# Patient Record
Sex: Male | Born: 1995 | Race: White | Hispanic: No | Marital: Single | Smoking: Never smoker
Health system: Southern US, Community
[De-identification: ages and names within clinical notes are randomized; demographics above are authoritative.]

## PROBLEM LIST (undated history)

## (undated) DIAGNOSIS — N289 Disorder of kidney and ureter, unspecified: Secondary | ICD-10-CM

## (undated) HISTORY — PX: NO PAST SURGERIES: SHX2092

---
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
## 2015-04-24 IMAGING — CR DG FEMUR 2V*R*
1 series · 4 of 4 positions shown · non-contrast
Comparison: None.

CLINICAL DATA: Injury to right upper leg while practicing football.
Knot at the right upper-outer thigh. Initial encounter.

EXAM:
RIGHT FEMUR - 2 VIEW

[Series 1: dxr femur right · 0.14mm/px · 4 of 4 slices shown]
[im 1/4]
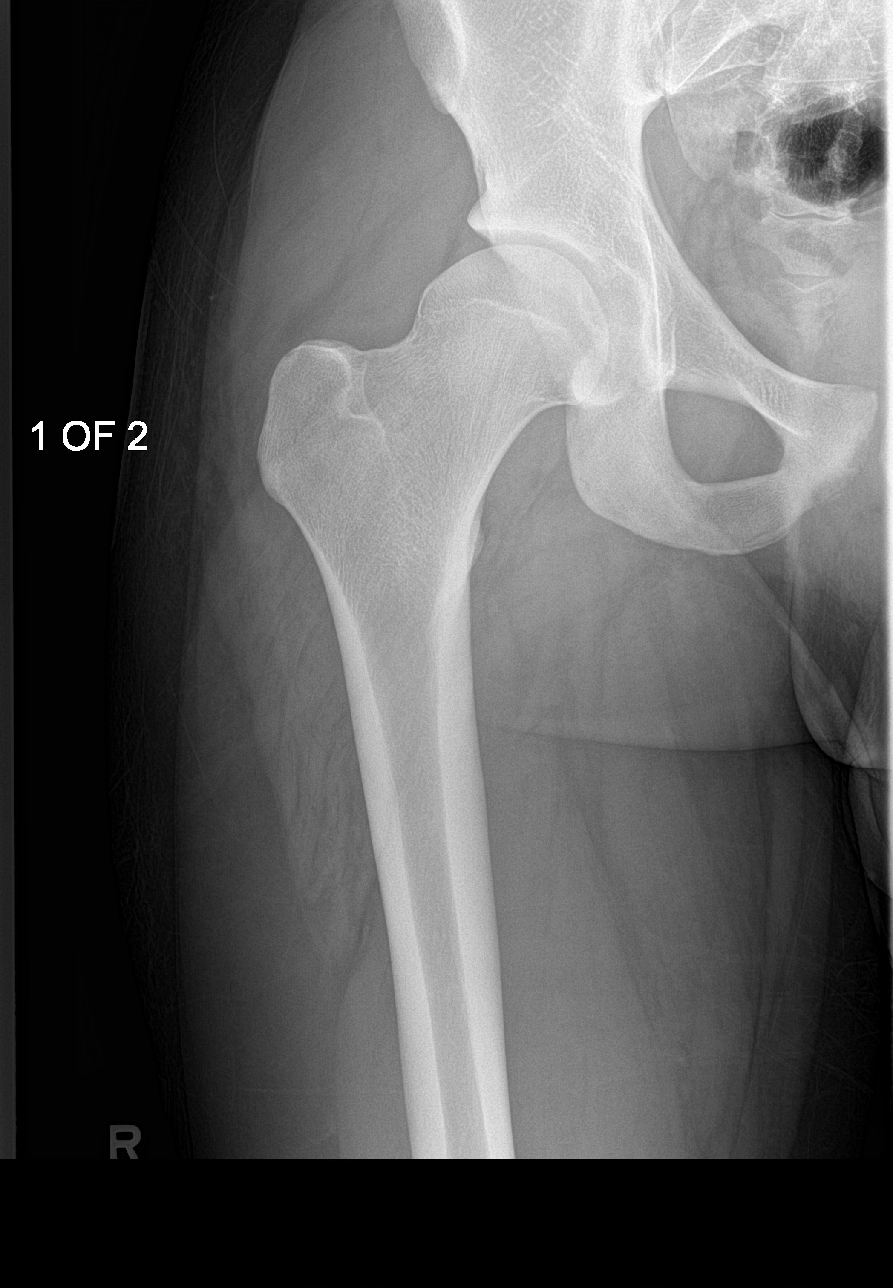
[im 2/4]
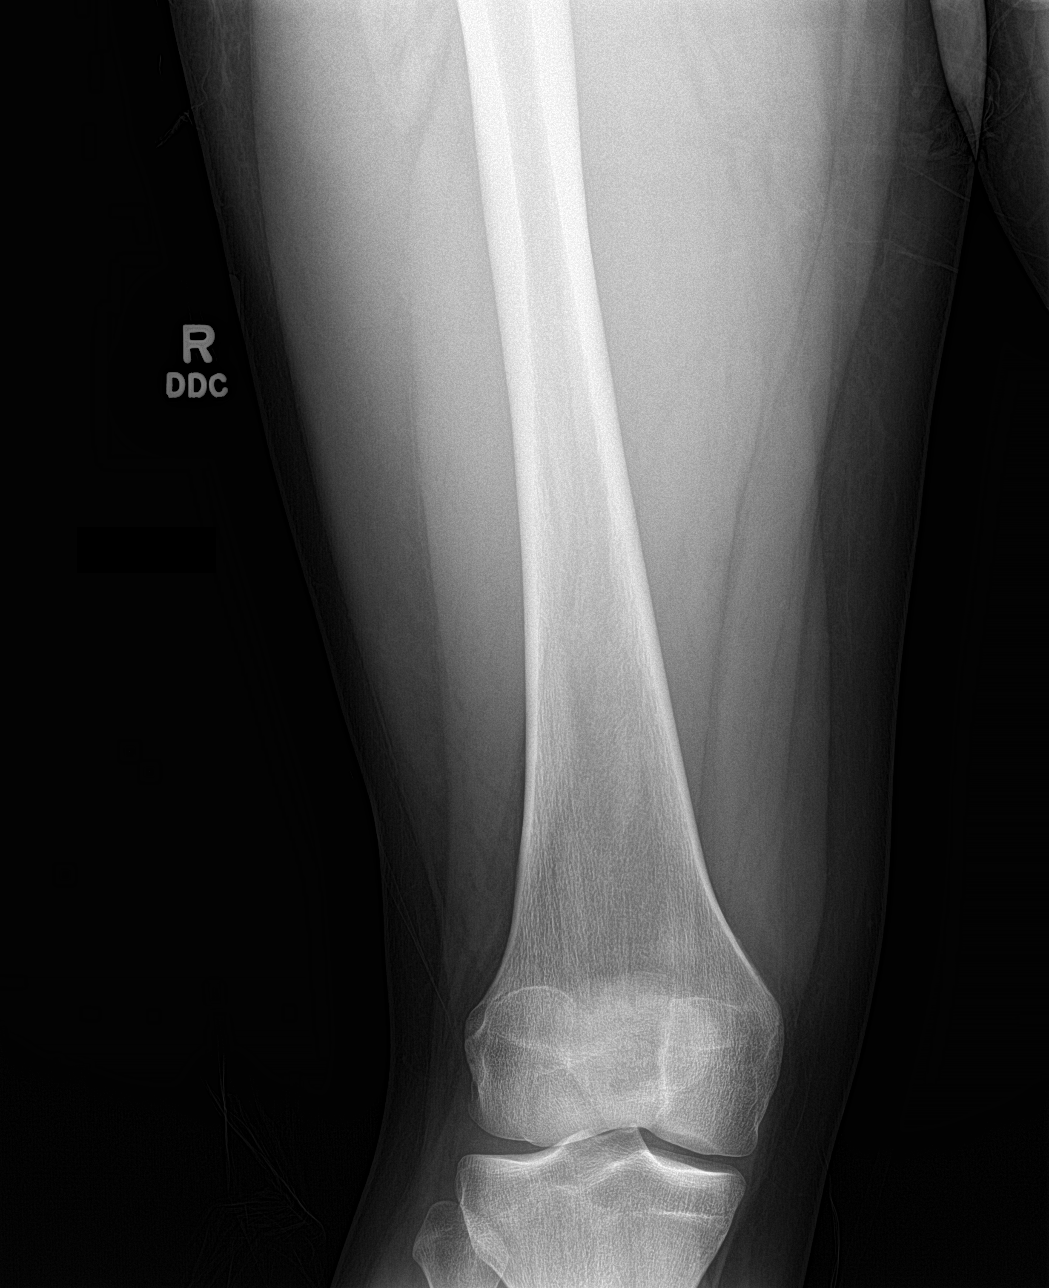
[im 3/4]
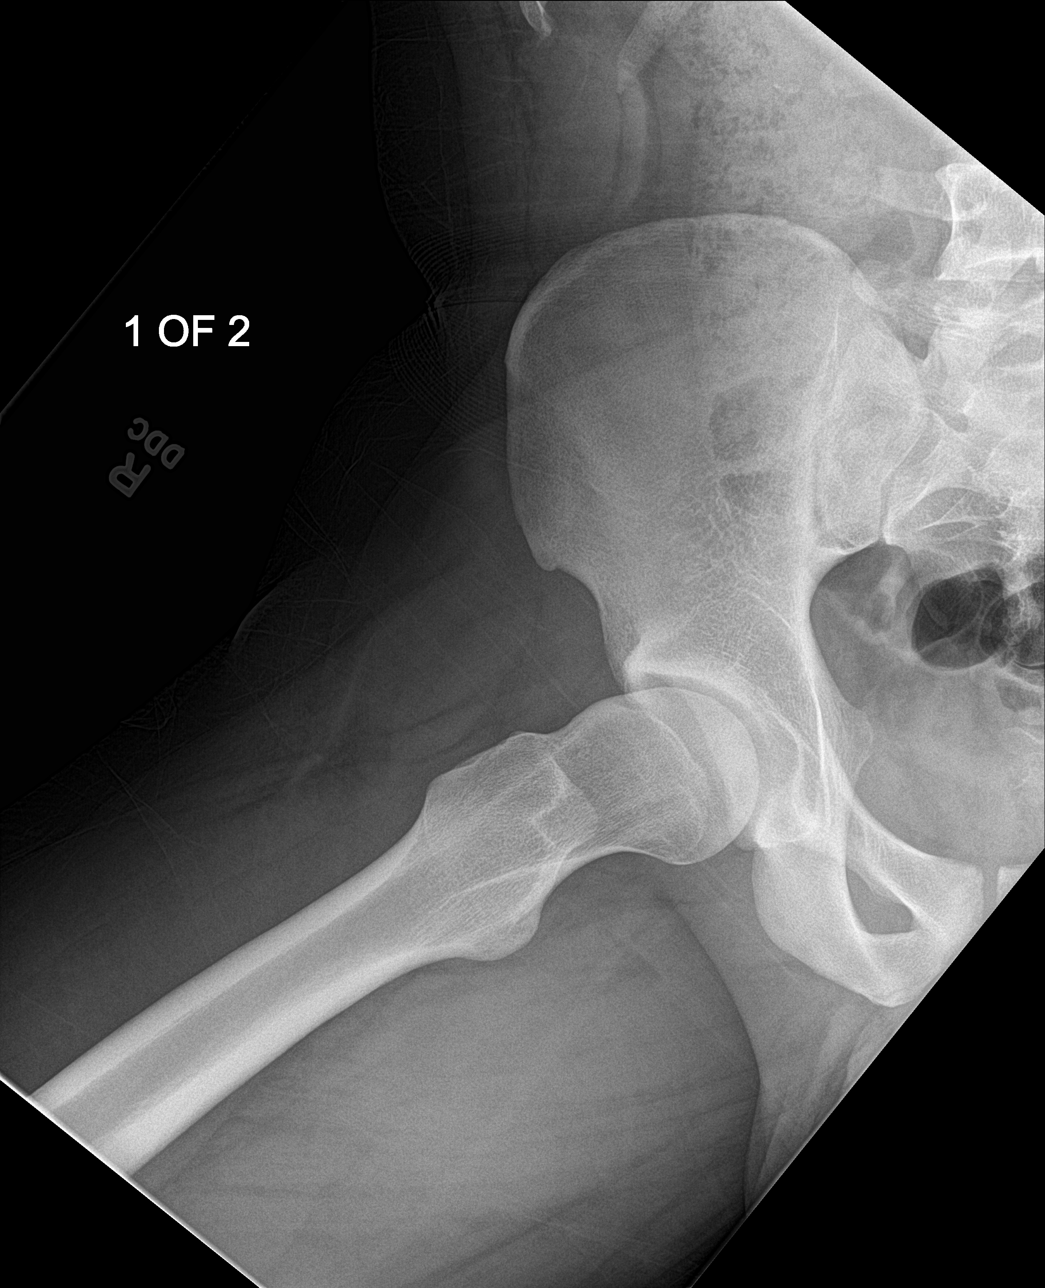
[im 4/4]
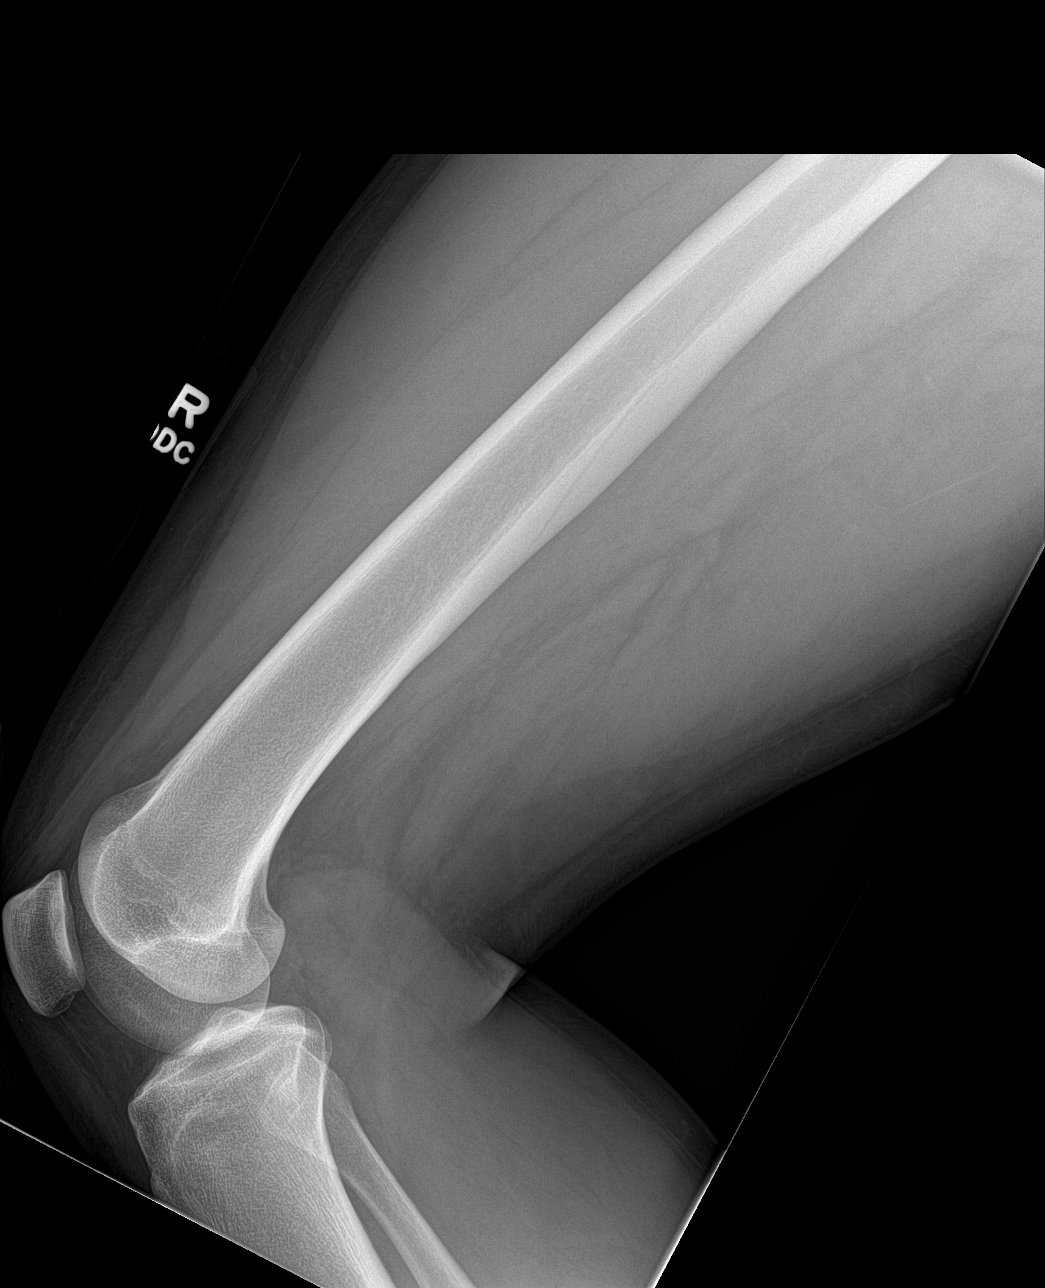

[4 of 4 positions shown; findings below may reference images not displayed]

FINDINGS: There is no evidence of fracture or dislocation. The right femur
appears intact. The right femoral head remains seated within the
acetabulum. The right sacroiliac joint is unremarkable in
appearance. The knee joint is unremarkable in appearance. No knee
joint effusion is identified.

Known soft tissue abnormality is not well characterized on
radiograph.
IMPRESSION: No evidence of fracture or dislocation.

## 2018-01-16 ENCOUNTER — Ambulatory Visit
Admission: EM | Admit: 2018-01-16 | Discharge: 2018-01-16 | Disposition: A | Discharge status: Home / Self Care | Payer: Worker's Compensation | Attending: Emergency Medicine | Admitting: Emergency Medicine

## 2018-01-16 ENCOUNTER — Other Ambulatory Visit: Payer: Self-pay

## 2018-01-16 ENCOUNTER — Encounter: Payer: Self-pay | Admitting: Emergency Medicine

## 2018-01-16 DIAGNOSIS — Z23 Encounter for immunization: Secondary | ICD-10-CM | POA: Diagnosis not present

## 2018-01-16 DIAGNOSIS — S61211A Laceration without foreign body of left index finger without damage to nail, initial encounter: Secondary | ICD-10-CM

## 2018-01-16 DIAGNOSIS — W260XXA Contact with knife, initial encounter: Secondary | ICD-10-CM

## 2018-01-16 MED ORDER — CEPHALEXIN 500 MG PO CAPS: 500.0000 mg | ORAL_CAPSULE | Freq: Four times a day (QID) | ORAL | 0 refills | Status: DC

## 2018-01-16 MED ORDER — TETANUS-DIPHTH-ACELL PERTUSSIS 5-2.5-18.5 LF-MCG/0.5 IM SUSP
0.5000 mL | Freq: Once | INTRAMUSCULAR | Status: AC
  Administered 2018-01-16: 0.5 mL via INTRAMUSCULAR

## 2018-01-16 MED ORDER — MUPIROCIN CALCIUM 2 % EX CREA: 1.0000 "application " | TOPICAL_CREAM | Freq: Two times a day (BID) | CUTANEOUS | 0 refills | Status: DC

## 2018-01-16 MED ORDER — KETOROLAC TROMETHAMINE 60 MG/2ML IM SOLN: 60.0000 mg | Freq: Once | INTRAMUSCULAR | Status: DC

## 2018-01-16 NOTE — ED Provider Notes (Signed)
[facility]    CSN: [id number] Arrival date & time: 01/16/18  1106     History   Chief Complaint Chief Complaint  Patient presents with  . Laceration    HPI Christian Benson is a 22 y.o. male.   Patient is a 22 year old male who was at work cutting his fingernails with a pocket knife when he cut the side of his left index finger.  Patient states he is unsure when his last tetanus was may be back in middle school.  He reports he is able to move it and has sensation.     History reviewed. No pertinent past medical history.  There are no active problems to display for this patient.   Past Surgical History:  Procedure Laterality Date  . NO PAST SURGERIES         Home Medications    Prior to Admission medications   Medication Sig Start Date End Date Taking? Authorizing Provider  cephALEXin (KEFLEX) 500 MG capsule Take 1 capsule (500 mg total) by mouth 4 (four) times daily. 01/16/18   Candis SchatzHarris, Nathanyel Defenbaugh D, PA-C    Family History Family History  Problem Relation Age of Onset  . Healthy Mother   . Healthy Father     Social History Social History   Tobacco Use  . Smoking status: Never Smoker  . Smokeless tobacco: Current User    Types: Snuff  Substance Use Topics  . Alcohol use: Yes    Comment: 3-4 times a week  . Drug use: Never     Allergies   Patient has no known allergies.   Review of Systems Review of Systems as noted above HPI.  Other systems reviewed and found to be negative   Physical Exam Triage Vital Signs ED Triage Vitals  Enc Vitals Group     BP 01/16/18 1154 (!) 160/95     Pulse Rate 01/16/18 1154 91     Resp 01/16/18 1154 17     Temp 01/16/18 1154 98.2 F (36.8 C)     Temp Source 01/16/18 1154 Oral     SpO2 01/16/18 1154 100 %     Weight 01/16/18 1155 235 lb (106.6 kg)     Height 01/16/18 1155 6' (1.829 m)     Head Circumference --      Peak Flow --      Pain Score 01/16/18 1155 4     Pain Loc --      Pain Edu? --      Excl. in [place]? --    No data found.  Updated Vital Signs BP (!) 160/95 (BP Location: Right Arm)   Pulse 91   Temp 98.2 F (36.8 C) (Oral)   Resp 17   Ht 6' (1.829 m)   Wt 235 lb (106.6 kg)   SpO2 100%   BMI 31.87 kg/m    Physical Exam  Constitutional: He appears well-developed and well-nourished.  Mild distress from finger pain  Musculoskeletal:       Left hand: He exhibits tenderness and laceration.       Hands:        UC Treatments / Results  Labs (all labs ordered are listed, but only abnormal results are displayed) Labs Reviewed - No data to display  EKG None  Radiology No results found.  Procedures Laceration Repair Date/Time: 01/16/2018 4:13 PM Performed by: Candis SchatzHarris, Darvell Monteforte D, PA-C Authorized by: Domenick GongMortenson, Ashley, MD   Consent:    Consent obtained:  Verbal  Consent given by:  Patient Anesthesia (see MAR for exact dosages):    Anesthesia method:  Local infiltration and nerve block   Local anesthetic:  Lidocaine 1% w/o epi   Block location:  Proximal L index finger   Block needle gauge:  27 G   Block injection procedure:  Negative aspiration for blood and incremental injection   Block outcome:  Incomplete block Laceration details:    Location:  Finger   Finger location:  L index finger   Length (cm):  6   Laceration depth: flap/avulsion type laceration. Repair type:    Repair type:  Simple Pre-procedure details:    Preparation:  Patient was prepped and draped in usual sterile fashion Exploration:    Hemostasis achieved with:  Direct pressure (minimal arteriole bleeding with manipulation, controlled with pressure)   Contaminated: no   Treatment:    Area cleansed with:  Hibiclens   Amount of cleaning:  Standard   Irrigation solution:  Sterile water   Irrigation method:  Syringe Skin repair:    Repair method:  Sutures   Suture size:  5-0   Suture material:  Prolene Approximation:    Approximation:  Close Post-procedure details:     Dressing:  Non-adherent dressing   Patient tolerance of procedure:  Tolerated well, no immediate complications   (including critical care time)  Medications Ordered in UC Medications  Tdap (BOOSTRIX) injection 0.5 mL (0.5 mLs Intramuscular Given 01/16/18 1158)    Initial Impression / Assessment and Plan / UC Course  I have reviewed the triage vital signs and the nursing notes.  Pertinent labs & imaging results that were available during my care of the patient were reviewed by me and considered in my medical decision making (see chart for details).     U-shaped laceration to the finger creating a avulsion type injury.  13 sutures placed with good approximation.  Some swelling noted.  Wound cleaned and dressed afterwards.  Placed in splint.  Patient given prescription for antibiotics.  Final Clinical Impressions(s) / UC Diagnoses   Final diagnoses:  Laceration of left index finger without foreign body without damage to nail, initial encounter     Discharge Instructions     -Keflex: one tablet 4 times a day for 5 days -keep dry for 24 hours -Tylenol for pain -cleanse with gentle soap and warm water afterwards -change bandage twice daily for two days then keep open and dry as much as possible -stitches out 10 days -return to clinic with any signs of infection... Redness, swelling, foul discharge, fever -keep in splint as much as able to protect while healing    ED Prescriptions    Medication Sig Dispense Auth. Provider   cephALEXin (KEFLEX) 500 MG capsule Take 1 capsule (500 mg total) by mouth 4 (four) times daily. 20 capsule Candis Schatz, PA-C   mupirocin cream (BACTROBAN) 2 %  (Status: Discontinued) Apply 1 application topically 2 (two) times daily. 15 g Candis Schatz, PA-C     Controlled Substance Prescriptions [place] Controlled Substance Registry consulted? Not Applicable   Candis Schatz, PA-C 01/16/18 1610

## 2018-01-16 NOTE — ED Triage Notes (Signed)
Pt here today for a lacertation of the left index finger. He was trimming his finger nails with his pocket knife and cut his finger. Pt is still activley bleeding. Tdap not UTD.

## 2018-01-16 NOTE — Discharge Instructions (Addendum)
-  Keflex: one tablet 4 times a day for 5 days -keep dry for 24 hours -Tylenol for pain -cleanse with gentle soap and warm water afterwards -change bandage twice daily for two days then keep open and dry as much as possible -stitches out 10 days -return to clinic with any signs of infection... Redness, swelling, foul discharge, fever -keep in splint as much as able to protect while healing

## 2019-06-22 ENCOUNTER — Ambulatory Visit
Admission: EM | Admit: 2019-06-22 | Discharge: 2019-06-22 | Disposition: A | Discharge status: Home / Self Care | Payer: PRIVATE HEALTH INSURANCE | Attending: Internal Medicine | Admitting: Internal Medicine

## 2019-06-22 ENCOUNTER — Other Ambulatory Visit: Payer: Self-pay

## 2019-06-22 DIAGNOSIS — N200 Calculus of kidney: Secondary | ICD-10-CM | POA: Diagnosis not present

## 2019-06-22 DIAGNOSIS — N23 Unspecified renal colic: Secondary | ICD-10-CM | POA: Insufficient documentation

## 2019-06-22 LAB — URINALYSIS, COMPLETE (UACMP) WITH MICROSCOPIC
Bilirubin Urine: NEGATIVE
Glucose, UA: NEGATIVE mg/dL
Ketones, ur: NEGATIVE mg/dL
Leukocytes,Ua: NEGATIVE
Nitrite: NEGATIVE
Specific Gravity, Urine: 1.02 (ref 1.005–1.030)
pH: 7.5 (ref 5.0–8.0)

## 2019-06-22 MED ORDER — TAMSULOSIN HCL 0.4 MG PO CAPS: 0.4000 mg | ORAL_CAPSULE | Freq: Every day | ORAL | 0 refills | Status: AC

## 2019-06-22 MED ORDER — TRAMADOL HCL 50 MG PO TABS: 50.0000 mg | ORAL_TABLET | Freq: Four times a day (QID) | ORAL | 0 refills | Status: DC | PRN

## 2019-06-22 NOTE — ED Triage Notes (Signed)
Left flank pain last week with some radiation to left groin area. Went away after drinking lots of water but came back again last night. Endorses vomiting.

## 2019-06-22 NOTE — ED Provider Notes (Signed)
[facility]    CSN: [id number] Arrival date & time: 06/22/19  1312      History   Chief Complaint Chief Complaint  Patient presents with  . Flank Pain    HPI Christian Benson is a 24 y.o. male with no past medical history comes to urgent care with complaints of left flank pain of 1 day duration.  Patient describes the pain as severe, and left flank region, radiates to the groin.  Pain is coli's.  Cky in nature.  No known relieving factors.  He has tried over-the-counter pain medications with no success.  He has had some nausea vomiting as well as diarrhea.  No fever or chills.  No dysuria urgency or frequency.  Patient had a similar episode a week ago that improved after he drank a lot of fluids.  HPI  History reviewed. No pertinent past medical history.  There are no problems to display for this patient.   Past Surgical History:  Procedure Laterality Date  . NO PAST SURGERIES         Home Medications    Prior to Admission medications   Medication Sig Start Date End Date Taking? Authorizing Provider  tamsulosin (FLOMAX) 0.4 MG CAPS capsule Take 1 capsule (0.4 mg total) by mouth daily for 10 days. 06/22/19 07/02/19  LampteyMyrene Galas, MD  traMADol (ULTRAM) 50 MG tablet Take 1 tablet (50 mg total) by mouth every 6 (six) hours as needed. 06/22/19   LampteyMyrene Galas, MD    Family History Family History  Problem Relation Age of Onset  . Healthy Mother   . Healthy Father     Social History Social History   Tobacco Use  . Smoking status: Never Smoker  . Smokeless tobacco: Current User    Types: Snuff  Substance Use Topics  . Alcohol use: Yes    Comment: 3-4 times a week  . Drug use: Never     Allergies   Patient has no known allergies.   Review of Systems Review of Systems  Constitutional: Negative for activity change, chills, fatigue and fever.  HENT: Negative.   Respiratory: Negative.   Gastrointestinal: Positive for diarrhea, nausea and  vomiting.  Genitourinary: Positive for flank pain. Negative for dysuria, frequency, scrotal swelling, testicular pain and urgency.  Skin: Negative.   Neurological: Negative for dizziness, seizures, numbness and headaches.  Hematological: Negative.   Psychiatric/Behavioral: Negative for confusion and decreased concentration.     Physical Exam Triage Vital Signs ED Triage Vitals  Enc Vitals Group     BP 06/22/19 1322 (!) 153/101     Pulse Rate 06/22/19 1322 79     Resp 06/22/19 1322 17     Temp 06/22/19 1322 98.4 F (36.9 C)     Temp Source 06/22/19 1322 Oral     SpO2 06/22/19 1322 99 %     Weight 06/22/19 1324 230 lb (104.3 kg)     Height 06/22/19 1324 6' (1.829 m)     Head Circumference --      Peak Flow --      Pain Score 06/22/19 1324 6     Pain Loc --      Pain Edu? --      Excl. in [place]? --    No data found.  Updated Vital Signs BP (!) 153/101 (BP Location: Left Arm)   Pulse 79   Temp 98.4 F (36.9 C) (Oral)   Resp 17   Ht 6' (1.829 m)  Wt 104.3 kg   SpO2 99%   BMI 31.19 kg/m   Visual Acuity Right Eye Distance:   Left Eye Distance:   Bilateral Distance:    Right Eye Near:   Left Eye Near:    Bilateral Near:     Physical Exam Constitutional:      General: He is in acute distress.     Appearance: Normal appearance. He is not ill-appearing.  Cardiovascular:     Rate and Rhythm: Normal rate and regular rhythm.     Pulses: Normal pulses.     Heart sounds: No murmur. No friction rub.  Pulmonary:     Effort: Pulmonary effort is normal. No respiratory distress.     Breath sounds: Normal breath sounds. No rhonchi or rales.  Abdominal:     General: Bowel sounds are normal. There is no distension.     Tenderness: There is no guarding.  Musculoskeletal:        General: No swelling or signs of injury. Normal range of motion.  Skin:    General: Skin is warm.     Capillary Refill: Capillary refill takes less than 2 seconds.     Coloration: Skin is not  jaundiced.     Findings: No bruising or erythema.  Neurological:     General: No focal deficit present.     Mental Status: He is alert and oriented to person, place, and time.      UC Treatments / Results  Labs (all labs ordered are listed, but only abnormal results are displayed) Labs Reviewed  URINALYSIS, COMPLETE (UACMP) WITH MICROSCOPIC - Abnormal; Notable for the following components:      Result Value   Hgb urine dipstick TRACE (*)    Protein, ur TRACE (*)    Bacteria, UA FEW (*)    All other components within normal limits    EKG   Radiology No results found.  Procedures Procedures (including critical care time)  Medications Ordered in UC Medications - No data to display  Initial Impression / Assessment and Plan / UC Course  I have reviewed the triage vital signs and the nursing notes.  Pertinent labs & imaging results that were available during my care of the patient were reviewed by me and considered in my medical decision making (see chart for details).     1.  Nephrolithiasis with renal colic: Urinalysis is significant for hemoglobin, leukocyte esterase. Urine cultures have been sent Tamsulosin 0.4 mg orally daily Tramadol 50 mg every 6 hours as needed for pain If patient gets worsening symptoms he is advised to return to urgent care to be reevaluated or go to the urologist office to be evaluated. Patient is encouraged to increase oral fluid intake to help with stone passage. Final Clinical Impressions(s) / UC Diagnoses   Final diagnoses:  Renal colic  Nephrolithiasis   Discharge Instructions   None    ED Prescriptions    Medication Sig Dispense Auth. Provider   tamsulosin (FLOMAX) 0.4 MG CAPS capsule Take 1 capsule (0.4 mg total) by mouth daily for 10 days. 10 capsule Nazire Fruth, Britta Mccreedy, MD   traMADol (ULTRAM) 50 MG tablet Take 1 tablet (50 mg total) by mouth every 6 (six) hours as needed. 15 tablet Lulabelle Desta, Britta Mccreedy, MD     I have reviewed  the PDMP during this encounter.   Merrilee Jansky, MD 06/22/19 1505

## 2019-12-14 ENCOUNTER — Other Ambulatory Visit: Payer: Self-pay

## 2019-12-14 ENCOUNTER — Encounter: Payer: Self-pay | Admitting: Emergency Medicine

## 2019-12-14 ENCOUNTER — Ambulatory Visit
Admission: EM | Admit: 2019-12-14 | Discharge: 2019-12-14 | Disposition: A | Discharge status: Home / Self Care | Payer: BC Managed Care – PPO | Attending: Family Medicine | Admitting: Family Medicine

## 2019-12-14 DIAGNOSIS — J029 Acute pharyngitis, unspecified: Secondary | ICD-10-CM | POA: Diagnosis present

## 2019-12-14 HISTORY — DX: Disorder of kidney and ureter, unspecified: N28.9

## 2019-12-14 LAB — GROUP A STREP BY PCR: Group A Strep by PCR: NOT DETECTED

## 2019-12-14 MED ORDER — AZITHROMYCIN 250 MG PO TABS: ORAL_TABLET | ORAL | 0 refills | Status: DC

## 2019-12-14 NOTE — Discharge Instructions (Signed)
Medication as prescribed.  Ibuprofen as needed.  Take care  Dr. Adriana Simas

## 2019-12-14 NOTE — ED Triage Notes (Signed)
Pt c/o sore throat, and right ear pain. Started about 4 days ago. Denies fever. He states it is hard to drink and eat due to his sore throat. Declines covid testing

## 2019-12-14 NOTE — ED Provider Notes (Signed)
[facility]    CSN: [id number] Arrival date & time: 12/14/19  1232      History   Chief Complaint Chief Complaint  Patient presents with  . Sore Throat  . Otalgia    right   HPI  24 year old male presents with the above complaints.  Patient reports that his symptoms started on Thursday.  He reports sore throat.  Also reports associated right ear pain.  No fever.  He reports difficulty swallowing.  Rates his pain a 7/10 in severity.  Described as a sharp pain.  No relieving factors.  No reported sick contacts.  No other complaints.  Past Medical History:  Diagnosis Date  . Renal disorder    kidney stone   Past Surgical History:  Procedure Laterality Date  . NO PAST SURGERIES     Home Medications    Prior to Admission medications   Medication Sig Start Date End Date Taking? Authorizing Provider  azithromycin (ZITHROMAX) 250 MG tablet 2 tablets on day 1, then 1 tablet daily on days 2-5. 12/14/19   Coral Spikes, DO  traMADol (ULTRAM) 50 MG tablet Take 1 tablet (50 mg total) by mouth every 6 (six) hours as needed. 06/22/19   LampteyMyrene Galas, MD    Family History Family History  Problem Relation Age of Onset  . Healthy Mother   . Healthy Father     Social History Social History   Tobacco Use  . Smoking status: Never Smoker  . Smokeless tobacco: Current User    Types: Snuff  Vaping Use  . Vaping Use: Some days  Substance Use Topics  . Alcohol use: Yes    Comment: 3-4 times a week  . Drug use: Never     Allergies   Patient has no known allergies.   Review of Systems Review of Systems  Constitutional: Negative for fever.  HENT: Positive for ear pain and sore throat.    Physical Exam Triage Vital Signs ED Triage Vitals  Enc Vitals Group     BP 12/14/19 1409 125/89     Pulse Rate 12/14/19 1409 88     Resp 12/14/19 1409 18     Temp 12/14/19 1409 98.8 F (37.1 C)     Temp Source 12/14/19 1409 Oral     SpO2 12/14/19 1409 99 %     Weight  12/14/19 1405 230 lb (104.3 kg)     Height 12/14/19 1405 6' (1.829 m)     Head Circumference --      Peak Flow --      Pain Score 12/14/19 1405 7     Pain Loc --      Pain Edu? --      Excl. in [place]? --    No data found.  Updated Vital Signs BP 125/89 (BP Location: Left Arm)   Pulse 88   Temp 98.8 F (37.1 C) (Oral)   Resp 18   Ht 6' (1.829 m)   Wt 104.3 kg   SpO2 99%   BMI 31.19 kg/m   Visual Acuity Right Eye Distance:   Left Eye Distance:   Bilateral Distance:    Right Eye Near:   Left Eye Near:    Bilateral Near:     Physical Exam Vitals and nursing note reviewed.  Constitutional:      General: He is not in acute distress.    Appearance: Normal appearance. He is not ill-appearing.  HENT:     Head: Normocephalic and atraumatic.  Right Ear: Tympanic membrane normal.     Left Ear: Tympanic membrane normal.     Mouth/Throat:     Pharynx: Posterior oropharyngeal erythema present. No oropharyngeal exudate.  Eyes:     General:        Right eye: No discharge.        Left eye: No discharge.     Conjunctiva/sclera: Conjunctivae normal.  Cardiovascular:     Rate and Rhythm: Normal rate and regular rhythm.  Pulmonary:     Effort: Pulmonary effort is normal.     Breath sounds: Normal breath sounds. No wheezing, rhonchi or rales.  Neurological:     Mental Status: He is alert.  Psychiatric:        Mood and Affect: Mood normal.        Behavior: Behavior normal.    UC Treatments / Results  Labs (all labs ordered are listed, but only abnormal results are displayed) Labs Reviewed  GROUP A STREP BY PCR    EKG   Radiology No results found.  Procedures Procedures (including critical care time)  Medications Ordered in UC Medications - No data to display  Initial Impression / Assessment and Plan / UC Course  I have reviewed the triage vital signs and the nursing notes.  Pertinent labs & imaging results that were available during my care of the patient  were reviewed by me and considered in my medical decision making (see chart for details).    24 year old male presents with acute pharyngitis.  Strep PCR negative.  Placing on azithromycin to cover for atypicals given persistent symptoms and the severity of his symptoms.  Final Clinical Impressions(s) / UC Diagnoses   Final diagnoses:  Pharyngitis, unspecified etiology     Discharge Instructions     Medication as prescribed.  Ibuprofen as needed.  Take care  Dr. Adriana Simas    ED Prescriptions    Medication Sig Dispense Auth. Provider   azithromycin (ZITHROMAX) 250 MG tablet 2 tablets on day 1, then 1 tablet daily on days 2-5. 6 tablet Tommie Sams, DO     PDMP not reviewed this encounter.   Tommie Sams, [place] 12/14/19 2035

## 2020-05-02 ENCOUNTER — Other Ambulatory Visit: Payer: Self-pay

## 2020-05-02 ENCOUNTER — Encounter (HOSPITAL_COMMUNITY): Payer: Self-pay

## 2020-05-02 ENCOUNTER — Emergency Department (HOSPITAL_COMMUNITY)
Admission: EM | Admit: 2020-05-02 | Discharge: 2020-05-03 | Disposition: A | Discharge status: Home / Self Care | Payer: BC Managed Care – PPO | Source: Home / Self Care | Attending: Emergency Medicine | Admitting: Emergency Medicine

## 2020-05-02 DIAGNOSIS — R45851 Suicidal ideations: Secondary | ICD-10-CM | POA: Insufficient documentation

## 2020-05-02 DIAGNOSIS — Z20822 Contact with and (suspected) exposure to covid-19: Secondary | ICD-10-CM | POA: Insufficient documentation

## 2020-05-02 DIAGNOSIS — F329 Major depressive disorder, single episode, unspecified: Secondary | ICD-10-CM | POA: Insufficient documentation

## 2020-05-02 DIAGNOSIS — F322 Major depressive disorder, single episode, severe without psychotic features: Secondary | ICD-10-CM | POA: Diagnosis not present

## 2020-05-02 LAB — COMPREHENSIVE METABOLIC PANEL
ALT: 19 U/L (ref 0–44)
AST: 20 U/L (ref 15–41)
Albumin: 4.4 g/dL (ref 3.5–5.0)
Alkaline Phosphatase: 45 U/L (ref 38–126)
Anion gap: 8 (ref 5–15)
BUN: 9 mg/dL (ref 6–20)
CO2: 25 mmol/L (ref 22–32)
Calcium: 9.1 mg/dL (ref 8.9–10.3)
Chloride: 106 mmol/L (ref 98–111)
Creatinine, Ser: 1.12 mg/dL (ref 0.61–1.24)
GFR, Estimated: >60 mL/min (ref >60)
Glucose, Bld: 114 mg/dL — ABNORMAL HIGH (ref 70–99)
Potassium: 3.9 mmol/L (ref 3.5–5.1)
Sodium: 139 mmol/L (ref 135–145)
Total Bilirubin: 1 mg/dL (ref 0.3–1.2)
Total Protein: 7.6 g/dL (ref 6.5–8.1)

## 2020-05-02 LAB — CBC
HCT: 47.6 % (ref 39.0–52.0)
Hemoglobin: 16 g/dL (ref 13.0–17.0)
MCH: 29.8 pg (ref 26.0–34.0)
MCHC: 33.6 g/dL (ref 30.0–36.0)
MCV: 88.6 fL (ref 80.0–100.0)
Platelets: 288 10*3/uL (ref 150–400)
RBC: 5.37 MIL/uL (ref 4.22–5.81)
RDW: 12.7 % (ref 11.5–15.5)
WBC: 8.8 10*3/uL (ref 4.0–10.5)
nRBC: 0 % (ref 0.0–0.2)

## 2020-05-02 LAB — SALICYLATE LEVEL: Salicylate Lvl: <7 mg/dL — ABNORMAL LOW (ref 7.0–30.0)

## 2020-05-02 LAB — RAPID URINE DRUG SCREEN, HOSP PERFORMED
Amphetamines: NOT DETECTED
Barbiturates: NOT DETECTED
Benzodiazepines: NOT DETECTED
Cocaine: NOT DETECTED
Opiates: NOT DETECTED
Tetrahydrocannabinol: NOT DETECTED

## 2020-05-02 LAB — ACETAMINOPHEN LEVEL: Acetaminophen (Tylenol), Serum: <10 ug/mL — ABNORMAL LOW (ref 10–30)

## 2020-05-02 LAB — ETHANOL: Alcohol, Ethyl (B): <10 mg/dL (ref <10)

## 2020-05-02 MED ORDER — IBUPROFEN 200 MG PO TABS: 600.0000 mg | ORAL_TABLET | Freq: Three times a day (TID) | ORAL | Status: DC | PRN

## 2020-05-02 NOTE — ED Notes (Addendum)
Spoke with patients mother who states she is concerned patient will leave if sent to [facility] due to flight risk.   Celso Amy family friend  [phone]

## 2020-05-02 NOTE — ED Notes (Addendum)
Pt's belongings placed at nurses station and labeled with patient labels. Security wanded pt.

## 2020-05-02 NOTE — ED Notes (Addendum)
Pt aware urine sample needed 

## 2020-05-02 NOTE — ED Triage Notes (Signed)
Patient arrived voluntarily with complaints of anger issues, reports telling his significant other he wanted to shoot himself in the head. Reports feeling like hurting someone else as well.

## 2020-05-02 NOTE — ED Provider Notes (Signed)
[facility]-EMERGENCY DEPT Provider Note   CSN: [id number] Arrival date & time: 05/02/20  1912     History Chief Complaint  Patient presents with  . Suicidal    Christian Benson is a 24 y.o. male.  HPI   Patient with no significant medical history presents to the emergency department with chief complaint of suicidal ideation.  Patient endorses today he had thoughts of taking a gun and shooting himself.  He states over the last few months he has been under a lot of pressure and has been battling with anxiety.  He denies suicidal thoughts in the past, has never attempted suicide, denies current drug or alcohol abuse.  He denies any homicidal ideations, delusions or hallucinations.  Patient has no other complaints at this time, he denies headaches, fevers, chills, shortness of breath, chest pain, abdominal pain, nausea, vomiting, diarrhea, pedal edema.  After reviewing patient's chart no psychiatric history present.  Spoke with patient's mother who is at bedside who informs me that the patient has been battling with depression and has his highs and lows.  she is concerned that if he does not get treatment this will does happen again.  Past Medical History:  Diagnosis Date  . Renal disorder    kidney stone    There are no problems to display for this patient.   Past Surgical History:  Procedure Laterality Date  . NO PAST SURGERIES         Family History  Problem Relation Age of Onset  . Healthy Mother   . Healthy Father     Social History   Tobacco Use  . Smoking status: Never Smoker  . Smokeless tobacco: Current User    Types: Snuff  Vaping Use  . Vaping Use: Some days  Substance Use Topics  . Alcohol use: Yes    Comment: 3-4 times a week  . Drug use: Never    Home Medications Prior to Admission medications   Medication Sig Start Date End Date Taking? Authorizing Provider  azithromycin (ZITHROMAX) 250 MG tablet 2 tablets on day 1, then 1  tablet daily on days 2-5. 12/14/19   Tommie Sams, DO  traMADol (ULTRAM) 50 MG tablet Take 1 tablet (50 mg total) by mouth every 6 (six) hours as needed. 06/22/19   Lamptey, Britta Mccreedy, MD    Allergies    Patient has no known allergies.  Review of Systems   Review of Systems  Constitutional: Negative for chills and fever.  HENT: Negative for congestion.   Respiratory: Negative for shortness of breath.   Cardiovascular: Negative for chest pain.  Gastrointestinal: Negative for abdominal pain, diarrhea, nausea and vomiting.  Genitourinary: Negative for enuresis and flank pain.  Musculoskeletal: Negative for back pain.  Skin: Negative for rash.  Neurological: Negative for dizziness.  Hematological: Does not bruise/bleed easily.  Psychiatric/Behavioral: Positive for self-injury and suicidal ideas. Negative for hallucinations.    Physical Exam Updated Vital Signs BP 134/84   Pulse 81   Temp 98.7 F (37.1 C) (Oral)   Resp 17   SpO2 99%   Physical Exam Vitals and nursing note reviewed.  Constitutional:      General: He is not in acute distress.    Appearance: He is not ill-appearing.  HENT:     Head: Normocephalic and atraumatic.     Right Ear: Tympanic membrane, ear canal and external ear normal.     Left Ear: Tympanic membrane, ear canal and external ear normal.  Nose: No congestion.     Mouth/Throat:     Mouth: Mucous membranes are moist.     Pharynx: Oropharynx is clear. No oropharyngeal exudate or posterior oropharyngeal erythema.  Eyes:     Conjunctiva/sclera: Conjunctivae normal.  Cardiovascular:     Rate and Rhythm: Normal rate and regular rhythm.     Pulses: Normal pulses.     Heart sounds: No murmur heard.  No friction rub. No gallop.   Pulmonary:     Effort: No respiratory distress.     Breath sounds: No wheezing, rhonchi or rales.  Abdominal:     Palpations: Abdomen is soft.     Tenderness: There is no guarding.  Musculoskeletal:     Right lower leg: No  edema.     Left lower leg: No edema.  Skin:    General: Skin is warm and dry.     Comments: Skin exam was performed no track marks, lacerations, abrasions, ecchymosis noted on exam.  No erythematous joints present  Neurological:     General: No focal deficit present.     Mental Status: He is alert.  Psychiatric:        Mood and Affect: Mood normal.     ED Results / Procedures / Treatments   Labs (all labs ordered are listed, but only abnormal results are displayed) Labs Reviewed  COMPREHENSIVE METABOLIC PANEL - Abnormal; Notable for the following components:      Result Value   Glucose, Bld 114 (*)    All other components within normal limits  SALICYLATE LEVEL - Abnormal; Notable for the following components:   Salicylate Lvl <7.0 (*)    All other components within normal limits  ACETAMINOPHEN LEVEL - Abnormal; Notable for the following components:   Acetaminophen (Tylenol), Serum <10 (*)    All other components within normal limits  RESPIRATORY PANEL BY RT PCR (FLU A&B, COVID)  ETHANOL  CBC  RAPID URINE DRUG SCREEN, HOSP PERFORMED    EKG None  EKG Interpretation  Date/Time: 05/02/20   21:12:00   Ventricular Rate:   75 PR Interval: 160   QRS Duration:121   QT Interval: 396   QTC Calculation:443   R Axis:  Normal axis   Text Interpretation:   rhythm with IVCD, no MI present read by Kristine Royal, MD.  Radiology No results found.  Procedures Procedures (including critical care time)  Medications Ordered in ED Medications  ibuprofen (ADVIL) tablet 600 mg (has no administration in time range)    ED Course  I have reviewed the triage vital signs and the nursing notes.  Pertinent labs & imaging results that were available during my care of the patient were reviewed by me and considered in my medical decision making (see chart for details).    MDM Rules/Calculators/A&P                          Patient presents with suicidal ideations.  He is alert, does not  appear in acute distress, vital signs reassuring.  Will order med clearance labs and consult with TTS for further evaluation.  Will defer IVC at this time as patient endorses he will stay for the entire treatment does not wish to leave at this time.  He was informed that if he tries to leave I will have to IVC him.  He is in agreement understands these terms.  CBC negative for leukocytosis or signs of anemia.  CMP negative for electrolyte  abnormalities, no metabolic acidosis, hyperglycemia of 114, no AKI, no anion gap noted.  Acetaminophen less than 10, salicylate less than 7, ethanol less than 10.  Rapid drug screen negative.  EKG shows sinus rhythm with IVCD, no MI present read by Kristine Royal, MD.  No signs of ischemia no ST elevation depression noted.  Low suspicion for systemic infection as patient is nontoxic-appearing, vital signs reassuring, no obvious infection on exam, no leukocytosis evident on CBC.  Low suspicion for ACS or cardiac abnormality as patient has chest pain, shortness of breath, EKG sinus rhythm sinus without signs of ischemia.  Low suspicion for abdominal abnormality requiring immediate intervention as patient denies abdominal pain, abdomen soft nontender to palpation no acute abdomen on exam.  Low suspicion for toxic ingestion as patient denies doing so, no abnormalities noted on lab work.  Low suspicion for drug abuse as rapid drug screen  is negative.  Patient is medically cleared and will await further recommendations from TTS.  Patient is here voluntarily and is not under IVC at this time. home meds have been ordered and   placed in a psych hold.    Final Clinical Impression(s) / ED Diagnoses Final diagnoses:  Suicidal ideation    Rx / DC Orders ED Discharge Orders    None       Carroll Sage, PA-C 05/02/20 2342    Sabas Sous, MD 05/02/20 2350

## 2020-05-02 NOTE — ED Notes (Signed)
Pt was given burgundy scrubs and instructed to change into them while placing his personal belongings in bags. Will check back to collect his belongings and have pt wanded by security.

## 2020-05-03 ENCOUNTER — Inpatient Hospital Stay (HOSPITAL_COMMUNITY)
Admission: AD | Admit: 2020-05-03 | Discharge: 2020-05-05 | DRG: 885 | Disposition: A | Discharge status: Home / Self Care | Payer: BC Managed Care – PPO | Source: Other Acute Inpatient Hospital | Attending: Psychiatry | Admitting: Psychiatry

## 2020-05-03 ENCOUNTER — Encounter (HOSPITAL_COMMUNITY): Payer: Self-pay | Admitting: Psychiatry

## 2020-05-03 DIAGNOSIS — Z20822 Contact with and (suspected) exposure to covid-19: Secondary | ICD-10-CM | POA: Diagnosis present

## 2020-05-03 DIAGNOSIS — F322 Major depressive disorder, single episode, severe without psychotic features: Secondary | ICD-10-CM | POA: Diagnosis present

## 2020-05-03 DIAGNOSIS — R45851 Suicidal ideations: Secondary | ICD-10-CM | POA: Diagnosis present

## 2020-05-03 LAB — RESPIRATORY PANEL BY RT PCR (FLU A&B, COVID)
Influenza A by PCR: NEGATIVE
Influenza B by PCR: NEGATIVE
SARS Coronavirus 2 by RT PCR: NEGATIVE

## 2020-05-03 MED ORDER — ALUM & MAG HYDROXIDE-SIMETH 200-200-20 MG/5ML PO SUSP: 30.0000 mL | ORAL | Status: DC | PRN

## 2020-05-03 MED ORDER — TRAZODONE HCL 50 MG PO TABS
50.0000 mg | ORAL_TABLET | Freq: Every evening | ORAL | Status: DC | PRN
  Filled 2020-05-03: qty 1

## 2020-05-03 MED ORDER — MAGNESIUM HYDROXIDE 400 MG/5ML PO SUSP: 30.0000 mL | Freq: Every day | ORAL | Status: DC | PRN

## 2020-05-03 MED ORDER — HYDROXYZINE HCL 25 MG PO TABS
25.0000 mg | ORAL_TABLET | Freq: Three times a day (TID) | ORAL | Status: DC | PRN
  Filled 2020-05-03: qty 1

## 2020-05-03 MED ORDER — ACETAMINOPHEN 325 MG PO TABS: 650.0000 mg | ORAL_TABLET | Freq: Four times a day (QID) | ORAL | Status: DC | PRN

## 2020-05-03 NOTE — BH Assessment (Signed)
Comprehensive Clinical Assessment (CCA) Note  05/03/2020 Christian Benson [id number]   Christian Benson is a 24 year old male presenting voluntarily to [facility] due to SI with plan to shoot himself in the head with a gun. Patient reporting increased anger symptoms. "I get anxious and then anger worked up full rage. Patient denied HI and psychosis. Patient reported onset of SI and depression was about a few months ago. Patient reported being under a lot of pressure and dealing with increased anxiety. Patient denies suicidal thoughts in the past, has never attempted suicide, denies current drug or alcohol abuse. Patient denies any homicidal ideations, delusions or hallucinations. Patient reported he was not on psych medications and is not receiving any outpatient mental services. Patient reported 4-8 hours sleep and normal appetite.   Patient is currently living with girlfriend. Patient reported no kids. Patient is unemployed Patient reported getting his GED. Patient reported no access to guns. Patient was cooperative during assessment.   Disposition Nira Conn, NP, patient meets inpatient criteria. Byrd Hesselbach [facility], accepted to [facility] Adult Unit [address] Bed 01.  Arrival time is after 12noon.  Attending is Dr. Jola Babinski.  RN report [phone].  Chief Complaint:  Chief Complaint  Patient presents with  . Suicidal   Visit Diagnosis: Major depressive disorder   CCA Screening, Triage and Referral (STR)  Patient Reported Information How did you hear about [place]? No data recorded Referral name: No data recorded Referral phone number: No data recorded  Whom do you see for routine medical problems? No data recorded Practice/Facility Name: No data recorded Practice/Facility Phone Number: No data recorded Name of Contact: No data recorded Contact Number: No data recorded Contact Fax Number: No data recorded Prescriber Name: No data recorded Prescriber Address (if known): No data recorded  What Is the Reason for  Your Visit/Call Today? No data recorded How Long Has This Been Causing You Problems? No data recorded What Do You Feel Would Help You the Most Today? No data recorded  Have You Recently Been in Any Inpatient Treatment (Hospital/Detox/Crisis Center/28-Day Program)? No data recorded Name/Location of Program/Hospital:No data recorded How Long Were You There? No data recorded When Were You Discharged? No data recorded  Have You Ever Received Services From [facility] Before? No data recorded Who Do You See at [facility]? No data recorded  Have You Recently Had Any Thoughts About Hurting Yourself? No data recorded Are You Planning to Commit Suicide/Harm Yourself At This time? No data recorded  Have you Recently Had Thoughts About Hurting Someone Karolee Ohs? No data recorded Explanation: No data recorded  Have You Used Any Alcohol or Drugs in the Past 24 Hours? No data recorded How Long Ago Did You Use Drugs or Alcohol? No data recorded What Did You Use and How Much? No data recorded  Do You Currently Have a Therapist/Psychiatrist? No data recorded Name of Therapist/Psychiatrist: No data recorded  Have You Been Recently Discharged From Any Office Practice or Programs? No data recorded Explanation of Discharge From Practice/Program: No data recorded    CCA Screening Triage Referral Assessment Type of Contact: No data recorded Is this Initial or Reassessment? No data recorded Date Telepsych consult ordered in CH[facility]:  No data recorded Time Telepsych consult ordered in [facility]L:  No data recorded  Patient Reported Information Reviewed? No data recorded Patient Left Without Being Seen? No data recorded Reason for Not Completing Assessment: No data recorded  Collateral Involvement: No data recorded  Does Patient Have a Court Appointed Legal Guardian?  No data recorded Name and Contact of Legal Guardian: No data recorded If Minor and Not Living with Parent(s), Who has Custody? No data recorded Is  CPS involved or ever been involved? No data recorded Is APS involved or ever been involved? No data recorded  Patient Determined To Be At Risk for Harm To Self or Others Based on Review of Patient Reported Information or Presenting Complaint? No data recorded Method: No data recorded Availability of Means: No data recorded Intent: No data recorded Notification Required: No data recorded Additional Information for Danger to Others Potential: No data recorded Additional Comments for Danger to Others Potential: No data recorded Are There Guns or Other Weapons in Your Home? No data recorded Types of Guns/Weapons: No data recorded Are These Weapons Safely Secured?                            No data recorded Who Could Verify You Are Able To Have These Secured: No data recorded Do You Have any Outstanding Charges, Pending Court Dates, Parole/Probation? No data recorded Contacted To Inform of Risk of Harm To Self or Others: No data recorded  Location of Assessment: No data recorded  Does Patient Present under Involuntary Commitment? No data recorded IVC Papers Initial File Date: No data recorded  [place] of Residence: No data recorded  Patient Currently Receiving the Following Services: No data recorded  Determination of Need: No data recorded  Options For Referral: No data recorded  CCA Biopsychosocial Intake/Chief Complaint:  SI with plan to shoot self with gun.  Current Symptoms/Problems: No data recorded  Patient Reported Schizophrenia/Schizoaffective Diagnosis in Past: No   Strengths: Self-awareness for help.  Preferences: No data recorded Abilities: No data recorded  Type of Services Patient Feels are Needed: No data recorded  Initial Clinical Notes/Concerns: No data recorded  Mental Health Symptoms Depression:  Change in energy/activity;Fatigue;Hopelessness   Duration of Depressive symptoms: Greater than two weeks   Mania:  None   Anxiety:   None   Psychosis:   None   Duration of Psychotic symptoms: No data recorded  Trauma:  None   Obsessions:  None   Compulsions:  None   Inattention:  None   Hyperactivity/Impulsivity:  N/A   Oppositional/Defiant Behaviors:  None   Emotional Irregularity:  None   Other Mood/Personality Symptoms:  No data recorded   Mental Status Exam Appearance and self-care  Stature:  Average   Weight:  Average weight   Clothing:  Age-appropriate   Grooming:  Normal   Cosmetic use:  Age appropriate   Posture/gait:  Normal   Motor activity:  No data recorded  Sensorium  Attention:  Normal   Concentration:  Normal   Orientation:  X5   Recall/memory:  Normal   Affect and Mood  Affect:  Depressed;Anxious   Mood:  Anxious;Depressed;Hopeless;Worthless   Relating  Eye contact:  Normal   Facial expression:  No data recorded  Attitude toward examiner:  Cooperative   Thought and Language  Speech flow: Normal   Thought content:  Appropriate to Mood and Circumstances   Preoccupation:  None   Hallucinations:  None   Organization:  No data recorded  [employer] of Knowledge:  Fair   Intelligence:  Average   Abstraction:  No data recorded  Judgement:  Poor   Reality Testing:  No data recorded  Insight:  Poor   Decision Making:  Impulsive   Social Functioning  Social Maturity:  No data recorded  Social Judgement:  No data recorded  Stress  Stressors:  Transitions;Relationship;Family conflict   Coping Ability:  Exhausted;Overwhelmed   Skill Deficits:  None   Supports:  Family     Religion: Religion/Spirituality Are You A Religious Person?: No  Leisure/Recreation:    Exercise/Diet: Exercise/Diet Have You Gained or Lost A Significant Amount of Weight in the Past Six Months?: No Do You Have Any Trouble Sleeping?: Yes Explanation of Sleeping Difficulties: 4-8   CCA Employment/Education Employment/Work Situation: Employment / Work Situation Employment  situation: Unemployed Has patient ever been in the [employer] and Companymilitary?: No  Education: Education Did Garment/textile technologistYou Graduate From [employer] School?: Yes Did Theme park managerYou Attend College?: Yes   CCA Family/Childhood History Family and Relationship History: Family history Does patient have children?: No  Childhood History:  Childhood History Did patient suffer any verbal/emotional/physical/sexual abuse as a child?: No Did patient suffer from severe childhood neglect?: No Has patient ever been sexually abused/assaulted/raped as an adolescent or adult?: No Was the patient ever a victim of a crime or a disaster?: No Witnessed domestic violence?: No Has patient been affected by domestic violence as an adult?: No  Child/Adolescent Assessment:     CCA Substance Use Alcohol/Drug Use: Alcohol / Drug Use Pain Medications: see MAR Prescriptions: see MAR Over the Counter: see MAR History of alcohol / drug use?: No history of alcohol / drug abuse   ASAM's:  Six Dimensions of Multidimensional Assessment  Dimension 1:  Acute Intoxication and/or Withdrawal Potential:      Dimension 2:  Biomedical Conditions and Complications:      Dimension 3:  Emotional, Behavioral, or Cognitive Conditions and Complications:     Dimension 4:  Readiness to Change:     Dimension 5:  Relapse, Continued use, or Continued Problem Potential:     Dimension 6:  Recovery/Living Environment:     ASAM Severity Score:    ASAM Recommended Level of Treatment:     Substance use Disorder (SUD)    Recommendations for Services/Supports/Treatments:    DSM5 Diagnoses:   Referrals to Alternative Service(s): Referred to Alternative Service(s):   Place:   Date:   Time:    Referred to Alternative Service(s):   Place:   Date:   Time:    Referred to Alternative Service(s):   Place:   Date:   Time:    Referred to Alternative Service(s):   Place:   Date:   Time:     Burnetta SabinLatisha D Dorathy Stallone, LCMHCComprehensive Clinical Assessment (CCA) Screening, Triage and  Referral Note  05/03/2020 Arlyss RepressMarshall Sachs [id number]  Chief Complaint:  Chief Complaint  Patient presents with  . Suicidal   Visit Diagnosis: Major depressive disorder  Patient Reported Information How did you hear about us? No data recorded  Referral name: No data recorded  Referral phone number: No data recorded Whom do you see for routine medical problems? No data recorded  Practice/Facility Name: No data recorded  Practice/Facility Phone Number: No data recorded  Name of Contact: No data recorded  Contact Number: No data recorded  Contact Fax Number: No data recorded  Prescriber Name: No data recorded  Prescriber Address (if known): No data recorded What Is the Reason for Your Visit/Call Today? No data recorded How Long Has This Been Causing You Problems? No data recorded Have You Recently Been in Any Inpatient Treatment (Hospital/Detox/Crisis Center/28-Day Program)? No data recorded  Name/Location of Program/Hospital:No data recorded  How Long Were You There? No data recorded  When  Were You Discharged? No data recorded Have You Ever Received Services From [facility] Before? No data recorded  Who Do You See at [facility]? No data recorded Have You Recently Had Any Thoughts About Hurting Yourself? No data recorded  Are You Planning to Commit Suicide/Harm Yourself At This time?  No data recorded Have you Recently Had Thoughts About Hurting Someone Karolee Ohs? No data recorded  Explanation: No data recorded Have You Used Any Alcohol or Drugs in the Past 24 Hours? No data recorded  How Long Ago Did You Use Drugs or Alcohol?  No data recorded  What Did You Use and How Much? No data recorded What Do You Feel Would Help You the Most Today? No data recorded Do You Currently Have a Therapist/Psychiatrist? No data recorded  Name of Therapist/Psychiatrist: No data recorded  Have You Been Recently Discharged From Any Office Practice or Programs? No data recorded  Explanation of  Discharge From Practice/Program:  No data recorded    CCA Screening Triage Referral Assessment Type of Contact: No data recorded  Is this Initial or Reassessment? No data recorded  Date Telepsych consult ordered in CHL:  No data recorded  Time Telepsych consult ordered in CHL:  No data recorded Patient Reported Information Reviewed? No data recorded  Patient Left Without Being Seen? No data recorded  Reason for Not Completing Assessment: No data recorded Collateral Involvement: No data recorded Does Patient Have a Court Appointed Legal Guardian? No data recorded  Name and Contact of Legal Guardian:  No data recorded If Minor and Not Living with Parent(s), Who has Custody? No data recorded Is CPS involved or ever been involved? No data recorded Is APS involved or ever been involved? No data recorded Patient Determined To Be At Risk for Harm To Self or Others Based on Review of Patient Reported Information or Presenting Complaint? No data recorded  Method: No data recorded  Availability of Means: No data recorded  Intent: No data recorded  Notification Required: No data recorded  Additional Information for Danger to Others Potential:  No data recorded  Additional Comments for Danger to Others Potential:  No data recorded  Are There Guns or Other Weapons in Your Home?  No data recorded   Types of Guns/Weapons: No data recorded   Are These Weapons Safely Secured?                              No data recorded   Who Could Verify You Are Able To Have These Secured:    No data recorded Do You Have any Outstanding Charges, Pending Court Dates, Parole/Probation? No data recorded Contacted To Inform of Risk of Harm To Self or Others: No data recorded Location of Assessment: No data recorded Does Patient Present under Involuntary Commitment? No data recorded  IVC Papers Initial File Date: No data recorded  [place] of Residence: No data recorded Patient Currently Receiving the Following  Services: No data recorded  Determination of Need: No data recorded  Options For Referral: No data recorded  Burnetta Sabin, [facility]

## 2020-05-03 NOTE — Tx Team (Signed)
Initial Treatment Plan 05/03/2020 4:52 PM Arlyss Repress KZL:[id number]    PATIENT STRESSORS: Financial difficulties Marital or family conflict Substance abuse   PATIENT STRENGTHS: Ability for insight Communication skills Motivation for treatment/growth Physical Health Supportive family/friends Work skills   PATIENT IDENTIFIED PROBLEMS: anxiety  depression  anger  Suicidal ideations  aggression             DISCHARGE CRITERIA:  Ability to meet basic life and health needs Improved stabilization in mood, thinking, and/or behavior Motivation to continue treatment in a less acute level of care Need for constant or close observation no longer present  PRELIMINARY DISCHARGE PLAN: Attend aftercare/continuing care group Outpatient therapy Return to previous living arrangement Return to previous work or school arrangements  PATIENT/FAMILY INVOLVEMENT: This treatment plan has been presented to and reviewed with the patient, Christian Benson.  The patient and family have been given the opportunity to ask questions and make suggestions.  Raylene Miyamoto, RN 05/03/2020, 4:52 PM

## 2020-05-03 NOTE — Progress Notes (Signed)
Pt accepted to Room 403-01 at [facility] after 1200 hours to the service of MD Clary.  Report may be called to [phone] after transportation has been arranged.

## 2020-05-03 NOTE — ED Notes (Signed)
Transportation called for transport to [facility].

## 2020-05-03 NOTE — ED Notes (Signed)
TTS cart at bedside. 
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
## 2020-05-03 NOTE — BH Assessment (Addendum)
Nira Conn, NP, patient meets inpatient criteria.  Byrd Hesselbach [facility], accepted to [facility] Adult Unit 403 Bed 01.  Arrival time is after 12noon.  Attending is Dr. Jola Babinski.  RN report [phone].

## 2020-05-03 NOTE — BH Assessment (Signed)
[facility] Assessment Progress Note  Per Nira Conn, NP, this pt requires psychiatric hospitalization at this time.  Byrd Hesselbach, RN, [facility] has assigned pt to [facility]-1; [facility] will be ready to receive pt after 12:00.  Pt has signed Voluntary Admission and Consent for Treatment, as well as Consent to Release Information, and signed forms have been faxed to [facility].  EDP Gwyneth Sprout, MD and pt's nurse, Whitney Post, have been notified, and Whitney Post agrees to send original paperwork along with pt via [facility], and to call report to [phone].  Doylene Canning, [place] Behavioral Health Coordinator [phone]

## 2020-05-04 DIAGNOSIS — F322 Major depressive disorder, single episode, severe without psychotic features: Principal | ICD-10-CM

## 2020-05-04 LAB — HEMOGLOBIN A1C
Hgb A1c MFr Bld: 4.6 % — ABNORMAL LOW (ref 4.8–5.6)
Mean Plasma Glucose: 85.32 mg/dL

## 2020-05-04 LAB — LIPID PANEL
Cholesterol: 140 mg/dL (ref 0–200)
HDL: 46 mg/dL (ref >40)
LDL Cholesterol: 81 mg/dL (ref 0–99)
Total CHOL/HDL Ratio: 3 RATIO
Triglycerides: 67 mg/dL (ref <150)
VLDL: 13 mg/dL (ref 0–40)

## 2020-05-04 LAB — TSH: TSH: 1.017 u[IU]/mL (ref 0.350–4.500)

## 2020-05-04 MED ORDER — ESCITALOPRAM OXALATE 10 MG PO TABS
10.0000 mg | ORAL_TABLET | Freq: Every day | ORAL | Status: DC
  Administered 2020-05-04 – 2020-05-05 (×2): 10 mg via ORAL
  Filled 2020-05-04 (×5): qty 1

## 2020-05-04 NOTE — Progress Notes (Signed)
Pt denies SI/HI/AVH.  Pt endorses anxiety and depression.  Pt said his goal is to "get my anxiety under control and be better able to deal with my emotions."  Pt has been calm this shift and taken medications without incident. Pt remains safe on the unit with q 15 min checks in place.

## 2020-05-04 NOTE — Progress Notes (Signed)
Cooperative with treatment, denies AVH, denies depression, denies suicidal ideation. Patient does endorse anxiety. Patient spent most of shift in the bed resting.

## 2020-05-04 NOTE — Progress Notes (Signed)
   05/04/20 2131  Psych Admission Type (Psych Patients Only)  Admission Status Involuntary  Psychosocial Assessment  Patient Complaints Anxiety  Eye Contact Fair  Facial Expression Anxious  Affect Anxious  Speech Logical/coherent  Interaction Assertive  Motor Activity Other (Comment) (wnl)  Appearance/Hygiene Unremarkable  Behavior Characteristics Cooperative  Mood Pleasant  Thought Process  Coherency WDL  Content WDL  Delusions None reported or observed  Perception WDL  Hallucination None reported or observed  Judgment Poor  Confusion None  Danger to Self  Current suicidal ideation? Denies  Danger to Others  Danger to Others None reported or observed
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
## 2020-05-04 NOTE — H&P (Signed)
Psychiatric Admission Assessment Adult  Patient Identification: Christian Benson MRN:  [id number] Date of Evaluation:  05/04/2020 Chief Complaint:  Severe major depression, single episode, without psychotic features (HCC) [F32.2] Principal Diagnosis: <principal problem not specified> Diagnosis:  Active Problems:   Severe major depression, single episode, without psychotic features (HCC)  History of Present Illness: As per admitting clinician"  Christian RepressMarshall Mastrangelo is a 24 year old male [facility] to SI with plan to shoot himself in the head with a gun. Patient reporting increased anger symptoms. "I get anxious and then anger worked up full rage. Patient denied HI and psychosis. Patient reported onset of SI and depression was about a few months ago. Patient reported being under a lot of pressure and dealing with increased anxiety. Patient denies suicidal thoughts in the past,has never attempted suicide, denies currentdrug or alcohol abuse. Patient denies any homicidal ideations, delusions or hallucinations. Patient reported he was not on psych medications and is not receiving any outpatient mental services. Patient reported 4-8 hours sleep and normal appetite.   Patient is currently living with girlfriend. Patient reported no kids. Patient is unemployed Patient reported getting his GED. Patient reported no access to guns. Patient was cooperative during assessment. "   Upon evaluation, patient acknowledges the above information is accurate.  He states that despite no prior infidelities, patient has anxious about his girlfriend cheating on him.  This amount of him being stressed out and ultimately led him to be hopeless with thoughts of shooting himself with a gun.  Patient was able to come to his senses and call his mom who convinced him to receive help on the psychiatric unit.  Patient denies any psychotic symptoms.  He denies any delusions.  He denies insomnia.   Associated  Signs/Symptoms: Depression Symptoms:  depressed mood, suicidal thoughts with specific plan, anxiety, Duration of Depression Symptoms: Greater than two weeks  (Hypo) Manic Symptoms:  Irritable Mood, Anxiety Symptoms:  Excessive Worry, Psychotic Symptoms:  NA Duration of Psychotic Symptoms: No data recorded PTSD Symptoms: NA Total Time spent with patient: 45 minutes  Past Psychiatric History: No significant past psychiatric history  Is the patient at risk to self? Yes.    Has the patient been a risk to self in the past 6 months? No.  Has the patient been a risk to self within the distant past? No.  Is the patient a risk to others? Yes.    Has the patient been a risk to others in the past 6 months? No.  Has the patient been a risk to others within the distant past? No.   Prior Inpatient Therapy:   Prior Outpatient Therapy:    Alcohol Screening: 1. How often do you have a drink containing alcohol?: 2 to 3 times a week 2. How many drinks containing alcohol do you have on a typical day when you are drinking?: 1 or 2 3. How often do you have six or more drinks on one occasion?: Weekly AUDIT-C Score: 6 4. How often during the last year have you found that you were not able to stop drinking once you had started?: Never 5. How often during the last year have you failed to do what was normally expected from you because of drinking?: Never 6. How often during the last year have you needed a first drink in the morning to get yourself going after a heavy drinking session?: Never 7. How often during the last year have you had a feeling of guilt  of remorse after drinking?: Never 8. How often during the last year have you been unable to remember what happened the night before because you had been drinking?: Never 9. Have you or someone else been injured as a result of your drinking?: No 10. Has a relative or friend or a doctor or another health worker been concerned about your drinking or  suggested you cut down?: No Alcohol Use Disorder Identification Test Final Score (AUDIT): 6 Substance Abuse History in the last 12 months:  No. Consequences of Substance Abuse: Legal Consequences:  DUI Family Consequences:  Family issues Previous Psychotropic Medications: No  Psychological Evaluations: No  Past Medical History:  Past Medical History:  Diagnosis Date  . Renal disorder    kidney stone    Past Surgical History:  Procedure Laterality Date  . NO PAST SURGERIES     Family History:  Family History  Problem Relation Age of Onset  . Healthy Mother   . Healthy Father    Family Psychiatric  History: Denies Tobacco Screening:   Social History:  Social History   Substance and Sexual Activity  Alcohol Use Yes   Comment: 3-4 times a week     Social History   Substance and Sexual Activity  Drug Use Never    Additional Social History:                           Allergies:  No Known Allergies Lab Results:  Results for orders placed or performed during the hospital encounter of 05/03/20 (from the past 48 hour(s))  Hemoglobin A1c     Status: Abnormal   Collection Time: 05/04/20  6:33 AM  Result Value Ref Range   Hgb A1c MFr Bld 4.6 (L) 4.8 - 5.6 %    Comment: (NOTE) Pre diabetes:          5.7%-6.4%  Diabetes:              >6.4%  Glycemic control for   <7.0% adults with diabetes    Mean Plasma Glucose 85.32 mg/dL    Comment: Performed at [facility], [address] [place], [place] [address]  Lipid panel     Status: None   Collection Time: 05/04/20  6:33 AM  Result Value Ref Range   Cholesterol 140 0 - 200 mg/dL   Triglycerides 67 <462 mg/dL   HDL 46 >70 mg/dL   Total CHOL/HDL Ratio 3.0 RATIO   VLDL 13 0 - 40 mg/dL   LDL Cholesterol 81 0 - 99 mg/dL    Comment:        Total Cholesterol/HDL:CHD Risk Coronary Heart Disease Risk Table                     Men   Women  1/2 Average Risk   3.4   3.3  Average Risk       5.0   4.4  2 X  Average Risk   9.6   7.1  3 X Average Risk  23.4   11.0        Use the calculated Patient Ratio above and the CHD Risk Table to determine the patient's CHD Risk.        ATP III CLASSIFICATION (LDL):  <100     mg/dL   Optimal  350-093  mg/dL   Near or Above  Optimal  130-159  mg/dL   Borderline  829-562  mg/dL   High  >130     mg/dL   Very High Performed at [facility], [address] [place], [place] [address]   TSH     Status: None   Collection Time: 05/04/20  6:33 AM  Result Value Ref Range   TSH 1.017 0.350 - 4.500 uIU/mL    Comment: Performed by a 3rd Generation assay with a functional sensitivity of <=0.01 uIU/mL. Performed at [facility], [address], [place], [place] [address]     Blood Alcohol level:  Lab Results  Component Value Date   ETH <10 05/02/2020    Metabolic Disorder Labs:  Lab Results  Component Value Date   HGBA1C 4.6 (L) 05/04/2020   MPG 85.32 05/04/2020   No results found for: PROLACTIN Lab Results  Component Value Date   CHOL 140 05/04/2020   TRIG 67 05/04/2020   HDL 46 05/04/2020   CHOLHDL 3.0 05/04/2020   VLDL 13 05/04/2020   LDLCALC 81 05/04/2020    Current Medications: Current Facility-Administered Medications  Medication Dose Route Frequency Provider Last Rate Last Admin  . acetaminophen (TYLENOL) tablet 650 mg  650 mg Oral Q6H PRN Nira Conn A, NP      . alum & mag hydroxide-simeth (MAALOX/MYLANTA) 200-200-20 MG/5ML suspension 30 mL  30 mL Oral Q4H PRN Nira Conn A, NP      . escitalopram (LEXAPRO) tablet 10 mg  10 mg Oral Daily Kiylah Loyer, Worthy Rancher, MD   10 mg at 05/04/20 1135  . hydrOXYzine (ATARAX/VISTARIL) tablet 25 mg  25 mg Oral TID PRN Nira Conn A, NP      . magnesium hydroxide (MILK OF MAGNESIA) suspension 30 mL  30 mL Oral Daily PRN Nira Conn A, NP      . traZODone (DESYREL) tablet 50 mg  50 mg Oral QHS PRN Jackelyn Poling, NP       PTA  Medications: Medications Prior to Admission  Medication Sig Dispense Refill Last Dose  . azithromycin (ZITHROMAX) 250 MG tablet 2 tablets on day 1, then 1 tablet daily on days 2-5. (Patient not taking: Reported on 05/03/2020) 6 tablet 0   . traMADol (ULTRAM) 50 MG tablet Take 1 tablet (50 mg total) by mouth every 6 (six) hours as needed. (Patient not taking: Reported on 05/03/2020) 15 tablet 0     Musculoskeletal: Strength & Muscle Tone: within normal limits Gait & Station: normal Patient leans: N/A  Psychiatric Specialty Exam: Physical Exam  Review of Systems  Blood pressure (!) 156/82, pulse 85, temperature 98.1 F (36.7 C), temperature source Oral, resp. rate 18, height 6' (1.829 m), weight 99.8 kg, SpO2 99 %.Body mass index is 29.84 kg/m.  General Appearance: Casual  Eye Contact:  Fair  Speech:  Clear and Coherent  Volume:  Normal  Mood:  Anxious  Affect:  Appropriate  Thought Process:  Coherent  Orientation:  Full (Time, Place, and Person)  Thought Content:  Logical  Suicidal Thoughts:  No, recent gesture  Homicidal Thoughts:  No  Memory:  Recent;   Fair  Judgement:  Fair  Insight:  Fair  Psychomotor Activity:  Normal  Concentration:  Concentration: Fair  Recall:  [employer] of Knowledge:  Fair  Language:  Fair  Akathisia:  No  Handed:  Right  AIMS (if indicated):     Assets:  Desire for Improvement Financial Resources/Insurance Physical Health Resilience Social Support  Talents/Skills  ADL's:  Intact  Cognition:  WNL  Sleep:       Treatment Plan Summary: Daily contact with patient to assess and evaluate symptoms and progress in treatment     This is a case of a 24 year old male presenting with some suicidal ideation in the context of social stressors.  On the differential diagnoses, along with MDD is some concern for a bipolar process.  However, patient is not acknowledging any history of psychotic symptoms, delusions, or insomnia.  He has no family  history of bipolar disorder.  He explains these thoughts as more of anxious feelings as opposed to delusions.  We will plan to start Lexapro at half dose of 10 mg, and observe the patient over the next several days prior to arranging aftercare and therapy for this patient.   Plan:  Lexapro 10 mg p.o. every morning Trazodone 50 mg p.o. nightly as needed insomnia We will continue to monitor and observe. We will continue to encourage group participation. Discharge and social work planning        Observation Level/Precautions:  15 minute checks  Laboratory:  CBC Chemistry Profile  Psychotherapy:    Medications:    Consultations:    Discharge Concerns:    Estimated LOS:  Other:       Physician Treatment Plan for Secondary Diagnosis: Active Problems:   Severe major depression, single episode, without psychotic features (HCC)  Long Term Goal(s): Improvement in symptoms so as ready for discharge  Short Term Goals: Ability to identify changes in lifestyle to reduce recurrence of condition will improve, Ability to verbalize feelings will improve, Ability to disclose and discuss suicidal ideas, Ability to demonstrate self-control will improve, Ability to identify and develop effective coping behaviors will improve, Ability to maintain clinical measurements within normal limits will improve and Compliance with prescribed medications will improve  I certify that inpatient services furnished can reasonably be expected to improve the patient's condition.    Clement Sayres, MD 11/17/20212:00 PM

## 2020-05-04 NOTE — Progress Notes (Signed)
Recreation Therapy Notes  Date: 11.17.21  Time: 0930  Location: [address] Dayroom   Group Topic: Stress Management\   Goal Area(s) Addresses:  Patient will identify positive stress management techniques.  Patient will identify benefits of using stress management post d/c.   Intervention: Stress Management   Activity : Meditation. LRT read a script that took patients on a journey into a wildlife sanctuary. Patients were to listen and follow as script was read to participate in activity.   Education: Stress Management, Discharge Planning.   Education Outcome: Acknowledges Education   Clinical Observations/Feedback: Pt did not attend activity.     Sumie Remsen, LRT/CTRS         Donni Oglesby A 05/04/2020 11:22 AM 
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
## 2020-05-04 NOTE — Tx Team (Signed)
Interdisciplinary Treatment and Diagnostic Plan Update  05/04/2020 Time of Session: 9:20am Yasseen Salls MRN: [id number]  Principal Diagnosis: <principal problem not specified>  Secondary Diagnoses: Active Problems:   Severe major depression, single episode, without psychotic features (HCC)   Current Medications:  Current Facility-Administered Medications  Medication Dose Route Frequency Provider Last Rate Last Admin   acetaminophen (TYLENOL) tablet 650 mg  650 mg Oral Q6H PRN Jackelyn Poling, NP       alum & mag hydroxide-simeth (MAALOX/MYLANTA) 200-200-20 MG/5ML suspension 30 mL  30 mL Oral Q4H PRN Jackelyn Poling, NP       hydrOXYzine (ATARAX/VISTARIL) tablet 25 mg  25 mg Oral TID PRN Jackelyn Poling, NP       magnesium hydroxide (MILK OF MAGNESIA) suspension 30 mL  30 mL Oral Daily PRN Jackelyn Poling, NP       traZODone (DESYREL) tablet 50 mg  50 mg Oral QHS PRN Jackelyn Poling, NP       PTA Medications: Medications Prior to Admission  Medication Sig Dispense Refill Last Dose   azithromycin (ZITHROMAX) 250 MG tablet 2 tablets on day 1, then 1 tablet daily on days 2-5. (Patient not taking: Reported on 05/03/2020) 6 tablet 0    traMADol (ULTRAM) 50 MG tablet Take 1 tablet (50 mg total) by mouth every 6 (six) hours as needed. (Patient not taking: Reported on 05/03/2020) 15 tablet 0     Patient Stressors: Financial difficulties Marital or family conflict Substance abuse  Patient Strengths: Ability for insight Barrister's clerk for treatment/growth Physical Health Supportive family/friends Work skills  Treatment Modalities: Medication Management, Group therapy, Case management,  1 to 1 session with clinician, Psychoeducation, Recreational therapy.   Physician Treatment Plan for Primary Diagnosis: <principal problem not specified> Long Term Goal(s):     Short Term Goals:    Medication Management: Evaluate patient's response, side effects, and tolerance  of medication regimen.  Therapeutic Interventions: 1 to 1 sessions, Unit Group sessions and Medication administration.  Evaluation of Outcomes: Progressing  Physician Treatment Plan for Secondary Diagnosis: Active Problems:   Severe major depression, single episode, without psychotic features (HCC)  Long Term Goal(s):     Short Term Goals:       Medication Management: Evaluate patient's response, side effects, and tolerance of medication regimen.  Therapeutic Interventions: 1 to 1 sessions, Unit Group sessions and Medication administration.  Evaluation of Outcomes: Progressing   RN Treatment Plan for Primary Diagnosis: <principal problem not specified> Long Term Goal(s): Knowledge of disease and therapeutic regimen to maintain health will improve  Short Term Goals: Ability to verbalize frustration and anger appropriately will improve, Ability to identify and develop effective coping behaviors will improve and Compliance with prescribed medications will improve  Medication Management: RN will administer medications as ordered by provider, will assess and evaluate patient's response and provide education to patient for prescribed medication. RN will report any adverse and/or side effects to prescribing provider.  Therapeutic Interventions: 1 on 1 counseling sessions, Psychoeducation, Medication administration, Evaluate responses to treatment, Monitor vital signs and CBGs as ordered, Perform/monitor CIWA, COWS, AIMS and Fall Risk screenings as ordered, Perform wound care treatments as ordered.  Evaluation of Outcomes: Progressing   LCSW Treatment Plan for Primary Diagnosis: <principal problem not specified> Long Term Goal(s): Safe transition to appropriate next level of care at discharge, Engage patient in therapeutic group addressing interpersonal concerns.  Short Term Goals: Engage patient in aftercare planning with referrals and resources, Increase social  support and Facilitate  acceptance of mental health diagnosis and concerns  Therapeutic Interventions: Assess for all discharge needs, 1 to 1 time with Social worker, Explore available resources and support systems, Assess for adequacy in community support network, Educate family and significant other(s) on suicide prevention, Complete Psychosocial Assessment, Interpersonal group therapy.  Evaluation of Outcomes: Progressing   Progress in Treatment: Attending groups: No. Participating in groups: No. Taking medication as prescribed: Yes. Toleration medication: Yes. Family/Significant other contact made: No, will contact:  CSW will continue to obtain consents and make contact Patient understands diagnosis: Yes. Discussing patient identified problems/goals with staff: Yes. Medical problems stabilized or resolved: Yes. Denies suicidal/homicidal ideation: Yes. Issues/concerns per patient self-inventory: Yes. Other:   New problem(s) identified: No, Describe:  CSW will continue to assess.  New Short Term/Long Term Goal(s): medication stabilization, elimination of SI thoughts, development of comprehensive mental wellness plan.  Patient Goals:  "figure out how to cope with my anxiety."  Discharge Plan or Barriers: Patient recently admitted. CSW will continue to follow and assess for appropriate referrals and possible discharge planning.  Reason for Continuation of Hospitalization: Depression Medication stabilization Suicidal ideation  Estimated Length of Stay: 3-5 days  Attendees: Patient: Christian Benson 05/04/2020 10:17 AM  Physician: Dr. Cindi Carbon 05/04/2020 10:17 AM  Nursing:  05/04/2020 10:17 AM  RN Care Manager: 05/04/2020 10:17 AM  Social Worker: Fredirick Lathe, LCSWA 05/04/2020 10:17 AM  Recreational Therapist:  05/04/2020 10:17 AM  Other:  05/04/2020 10:17 AM  Other:  05/04/2020 10:17 AM  Other: 05/04/2020 10:17 AM    Scribe for Treatment Team: Felizardo Hoffmann, LCSWA 05/04/2020 10:17 AM

## 2020-05-04 NOTE — [facility] Group Notes (Signed)
Adult Psychoeducational Group Note  Date:  05/04/2020 Time:  9:55 PM  Group Topic/Focus:  Wrap-Up Group:   The focus of this group is to help patients review their daily goal of treatment and discuss progress on daily workbooks.  Participation Level:  Active  Participation Quality:  Appropriate and Attentive  Affect:  Appropriate  Cognitive:  Alert and Appropriate  Insight: Appropriate and Good  Engagement in Group:  Engaged  Modes of Intervention:  Discussion and Education  Additional Comments:  Pt attended and participated in wrap up group this evening and rated their day an 8/10. Pt stated that they want to find coping skills for depression and anger. Pt stated that working and using their hands helps them cope with stress and anger.   Christian Benson 05/04/2020, 9:55 PM

## 2020-05-04 NOTE — [facility] Group Notes (Signed)
[facility] LCSW Group Therapy  05/04/2020 1:58 PM  Type of Therapy:  Group Therapy: Discussion of CBT focused on core beliefs and emotions.  Participation Level:  Active  Participation Quality:  Appropriate and Attentive  Engagement in Therapy:  Engaged  Modes of Intervention:  Discussion and Education  Summary of Progress/Problems: Pt accepted handouts. Pt did not share during the main discussion but did share during the icebreaker. Pt shared that if he could go anywhere in the world he would go to to [place] with her family.   Felizardo Hoffmann 05/04/2020, 1:58 PM

## 2020-05-04 NOTE — [facility] Group Notes (Signed)
The focus of this group is to help patients establish daily goals to achieve during treatment and discuss how the patient can incorporate goal setting into their daily lives to aide in recovery.  Pt did not attend group 

## 2020-05-05 MED ORDER — ESCITALOPRAM OXALATE 10 MG PO TABS: 10.0000 mg | ORAL_TABLET | Freq: Every day | ORAL | 0 refills | Status: AC

## 2020-05-05 MED ORDER — TRAZODONE HCL 50 MG PO TABS: 50.0000 mg | ORAL_TABLET | Freq: Every evening | ORAL | 0 refills | Status: AC | PRN

## 2020-05-05 NOTE — [facility] Suicide Risk Assessment (Signed)
[facility] INPATIENT:  Family/Significant Other Suicide Prevention Education  Suicide Prevention Education:  Education Completed; Brennyn Haisley [phone] (Mother) has been identified by the patient as the family member/significant other with whom the patient will be residing, and identified as the person(s) who will aid the patient in the event of a mental health crisis (suicidal ideations/suicide attempt).  With written consent from the patient, the family member/significant other has been provided the following suicide prevention education, prior to the and/or following the discharge of the patient.  The suicide prevention education provided includes the following:  Suicide risk factors  Suicide prevention and interventions  National Suicide Hotline telephone number  [facility] assessment telephone number  [facility] Emergency Assistance 911  [facility] and/or Residential Mobile Crisis Unit telephone number  Request made of family/significant other to:  Remove weapons (e.g., guns, rifles, knives), all items previously/currently identified as safety concern.    Remove drugs/medications (over-the-counter, prescriptions, illicit drugs), all items previously/currently identified as a safety concern.  The family member/significant other verbalizes understanding of the suicide prevention education information provided.  The family member/significant other agrees to remove the items of safety concern listed above.   Mrs. Leverich states that her son has never done this before.  Mrs. Neubert states that her son has not lived there but he does come around and visit.  Mrs. Staples states that her son will often be fine and then will become angry and states that he is overwhelmed with things in his life.  Mrs. Lall state that her son is not in a good relationship at this moment and that there have been a lot of infidelity and jealousy.  Mrs. Schubring states that he has told her  that he is not happy with his relationship but that he feels bad about ending it and kicking her out because she has no where to live.  Mrs. Brann states that her son is coming to live with her in [place] and that his brother is coming to town to spend some time with him and help him to refocus.  Mrs. Sanguinetti states that she will continue to support him and make sure he goes to appointments and takes his medications.  Mrs. Chicoine states that the firearms in the home are locked up and there are no weapons in the home.  CSW completed SPE with Mrs. Beverely Pace.     Metro Kung Kevona Lupinacci 05/05/2020, 1:27 PM

## 2020-05-05 NOTE — Discharge Summary (Signed)
Physician Discharge Summary Note  Patient:  Christian Benson is an 24 y.o., male MRN:  [id number] DOB:  1996/06/18 Patient phone:  [phone] (home)  Patient address:   [address] [place] [address],  Total Time spent with patient: 15 minutes  Date of Admission:  05/03/2020 Date of Discharge: 05/05/20  Reason for Admission:  suicidal ideation  Principal Problem: <principal problem not specified> Discharge Diagnoses: Active Problems:   Severe major depression, single episode, without psychotic features [facility])   Past Psychiatric History: No significant past psychiatric history  Past Medical History:  Past Medical History:  Diagnosis Date  . Renal disorder    kidney stone    Past Surgical History:  Procedure Laterality Date  . NO PAST SURGERIES     Family History:  Family History  Problem Relation Age of Onset  . Healthy Mother   . Healthy Father    Family Psychiatric  History: Denies Social History:  Social History   Substance and Sexual Activity  Alcohol Use Yes   Comment: 3-4 times a week     Social History   Substance and Sexual Activity  Drug Use Never    Social History   Socioeconomic History  . Marital status: Single    Spouse name: Not on file  . Number of children: Not on file  . Years of education: Not on file  . Highest education level: Not on file  Occupational History  . Not on file  Tobacco Use  . Smoking status: Never Smoker  . Smokeless tobacco: Current User    Types: Snuff  Vaping Use  . Vaping Use: Some days  Substance and Sexual Activity  . Alcohol use: Yes    Comment: 3-4 times a week  . Drug use: Never  . Sexual activity: Yes  Other Topics Concern  . Not on file  Social History Narrative  . Not on file   Social Determinants of Health   Financial Resource Strain:   . Difficulty of Paying Living Expenses: Not on file  Food Insecurity:   . Worried About Programme researcher, broadcasting/film/video in the Last Year: Not on file  .  Ran Out of Food in the Last Year: Not on file  Transportation Needs:   . Lack of Transportation (Medical): Not on file  . Lack of Transportation (Non-Medical): Not on file  Physical Activity:   . Days of Exercise per Week: Not on file  . Minutes of Exercise per Session: Not on file  Stress:   . Feeling of Stress : Not on file  Social Connections:   . Frequency of Communication with Friends and Family: Not on file  . Frequency of Social Gatherings with Friends and Family: Not on file  . Attends Religious Services: Not on file  . Active Member of Clubs or Organizations: Not on file  . Attends Banker Meetings: Not on file  . Marital Status: Not on file    Hospital Course:  From admission H&P: Philipp Callegari is a 24 year old male presenting voluntarily to [facility] due to SI with plan to shoot himself in the head with a gun. Patient reporting increased anger symptoms. "I get anxious and then anger worked up full rage. Patient denied HI and psychosis. Patient reported onset of SI and depression was about a few months ago. Patient reported being under a lot of pressure and dealing with increased anxiety. Patientdenies suicidal thoughts in the past,has never attempted suicide, denies currentdrug or alcohol  abuse.Patientdenies any homicidal ideations, delusions or hallucinations. Patient reported he was not on psych medications and is not receiving any outpatient mental services. Patient reported 4-8 hours sleep and normal appetite.  Patient is currently living with girlfriend. Patient reported no kids. Patient is unemployed Patient reported getting his GED. Patient reported no access to guns. Patient was cooperative during assessment. " Upon evaluation, patient acknowledges the above information is accurate.  He states that despite no prior infidelities, patient has anxious about his girlfriend cheating on him.  This amount of him being stressed out and ultimately led him to be hopeless  with thoughts of shooting himself with a gun.  Patient was able to come to his senses and call his mom who convinced him to receive help on the psychiatric unit. Patient denies any psychotic symptoms.  He denies any delusions.  He denies insomnia.  Mr. Truex was admitted for depression with suicidal ideation as described above. He remained on the [facility] unit for two days. He was started on Lexapro and trazodone. He participated in group therapy on the unit. He responded well to treatment with no adverse effects reported. He has shown improved mood, affect, sleep, and interaction. He denies any SI/HI/AVH and contracts for safety. He is discharging on the medications listed below. Collateral information was obtained from his mother. Patient will be discharging home with her mother, and she denies safety concerns for discharge. He agrees to follow up at [facility], [facility], or [facility] (see below). Patient is provided with prescriptions for medications upon discharge. His mother is picking him up for discharge home.  Physical Findings: AIMS: Facial and Oral Movements Muscles of Facial Expression: None, normal Lips and Perioral Area: None, normal Jaw: None, normal Tongue: None, normal,Extremity Movements Upper (arms, wrists, hands, fingers): None, normal Lower (legs, knees, ankles, toes): None, normal, Trunk Movements Neck, shoulders, hips: None, normal, Overall Severity Severity of abnormal movements (highest score from questions above): None, normal Incapacitation due to abnormal movements: None, normal Patient's awareness of abnormal movements (rate only patient's report): No Awareness, Dental Status Current problems with teeth and/or dentures?: No Does patient usually wear dentures?: No  CIWA:    COWS:      Psychiatric Specialty Exam: Physical Exam Vitals and nursing note reviewed.  Constitutional:      Appearance: He is well-developed.  Cardiovascular:     Rate and Rhythm: Normal rate.   Pulmonary:     Effort: Pulmonary effort is normal.  Neurological:     Mental Status: He is alert and oriented to person, place, and time.     Review of Systems  Constitutional: Negative.   Respiratory: Negative for cough and shortness of breath.   Psychiatric/Behavioral: Negative for agitation, behavioral problems, confusion, decreased concentration, dysphoric mood, hallucinations, self-injury, sleep disturbance and suicidal ideas. The patient is not nervous/anxious and is not hyperactive.     Blood pressure (!) 142/97, pulse 74, temperature 98.8 F (37.1 C), temperature source Oral, resp. rate 16, height 6' (1.829 m), weight 99.8 kg, SpO2 99 %.Body mass index is 29.84 kg/m.  See MD's discharge SRA      Has this patient used any form of tobacco in the last 30 days? (Cigarettes, Smokeless Tobacco, Cigars, and/or Pipes)  No  Blood Alcohol level:  Lab Results  Component Value Date   ETH <10 05/02/2020    Metabolic Disorder Labs:  Lab Results  Component Value Date   HGBA1C 4.6 (L) 05/04/2020   MPG 85.32 05/04/2020   No  results found for: PROLACTIN Lab Results  Component Value Date   CHOL 140 05/04/2020   TRIG 67 05/04/2020   HDL 46 05/04/2020   CHOLHDL 3.0 05/04/2020   VLDL 13 05/04/2020   LDLCALC 81 05/04/2020    See Psychiatric Specialty Exam and Suicide Risk Assessment completed by Attending Physician prior to discharge.  Discharge destination:  Home  Is patient on multiple antipsychotic therapies at discharge:  No   Has Patient had three or more failed trials of antipsychotic monotherapy by history:  No  Recommended Plan for Multiple Antipsychotic Therapies: NA  Discharge Instructions    Discharge instructions   Complete by: As directed    Activity as tolerated. Diet as recommended by primary care physician. Keep all scheduled follow-up appointments as recommended.     Allergies as of 05/05/2020   No Known Allergies     Medication List    STOP taking  these medications   azithromycin 250 MG tablet Commonly known as: ZITHROMAX   traMADol 50 MG tablet Commonly known as: ULTRAM     TAKE these medications     Indication  escitalopram 10 MG tablet Commonly known as: LEXAPRO Take 1 tablet (10 mg total) by mouth daily. Start taking on: May 06, 2020  Indication: Major Depressive Disorder   traZODone 50 MG tablet Commonly known as: DESYREL Take 1 tablet (50 mg total) by mouth at bedtime as needed for sleep.  Indication: Trouble Sleeping       Follow-up Information    [facility] Follow up.   Why: This provider is another option for therapy and medication management services in your area. Contact information: [address] [place], [place] [address]  Phone: [phone]        [facility] Follow up.   Why: This provider is another option for therapy and medication management services in your area. Contact information: [address] [place], [place] [address]  Phone: [phone]       [facility] Follow up on 05/10/2020.   Why: You have an appointment on 05/10/20 at 12:00 pm for therapy and medication managment services.  This will be a Heritage manager appointment. Contact information: [address] [place], [place] [address]  P: [phone] F: [phone]              Follow-up recommendations: Activity as tolerated. Diet as recommended by primary care physician. Keep all scheduled follow-up appointments as recommended.   Comments:   Patient is instructed to take all prescribed medications as recommended. Report any side effects or adverse reactions to your outpatient psychiatrist. Patient is instructed to abstain from alcohol and illegal drugs while on prescription medications. In the event of worsening symptoms, patient is instructed to call the crisis hotline, 911, or go to the nearest emergency department for evaluation and  treatment.  Signed: Aldean Baker, NP 05/05/2020, 5:06 PM

## 2020-05-05 NOTE — [facility] Suicide Risk Assessment (Signed)
[facility] Discharge Suicide Risk Assessment   Principal Problem: <principal problem not specified> Discharge Diagnoses: Active Problems:   Severe major depression, single episode, without psychotic features (HCC)   Total Time spent with patient: 20 minutes  Musculoskeletal: Strength & Muscle Tone: within normal limits Gait & Station: normal Patient leans: N/A  Psychiatric Specialty Exam: Review of Systems  Blood pressure (!) 142/97, pulse 74, temperature 98.8 F (37.1 C), temperature source Oral, resp. rate 16, height 6' (1.829 m), weight 99.8 kg, SpO2 99 %.Body mass index is 29.84 kg/m.  General Appearance: Casual  Eye Contact::  Good  Speech:  Clear and Coherent409  Volume:  Normal  Mood:  Euthymic  Affect:  Appropriate  Thought Process:  Coherent  Orientation:  Full (Time, Place, and Person)  Thought Content:  Logical  Suicidal Thoughts:  No  Homicidal Thoughts:  No  Memory:  Recent;   Fair  Judgement:  Fair  Insight:  Fair  Psychomotor Activity:  Normal  Concentration:  Fair  Recall:  [employer] of Knowledge:Fair  Language: Fair  Akathisia:  No  Handed:  Right  AIMS (if indicated):     Assets:  Communication Skills Desire for Improvement Financial Resources/Insurance Housing Intimacy Leisure Time Physical Health Resilience Social Support Talents/Skills Transportation Vocational/Educational  Sleep:  Number of Hours: 5.75  Cognition: WNL  ADL's:  Intact   Mental Status Per Nursing Assessment::   On Admission:  Suicidal ideation indicated by patient, Suicidal ideation indicated by others, Intention to act on suicide plan, Suicide plan, Belief that plan would result in death, Plan includes specific time, place, or method  Demographic Factors:  Male and Adolescent or young adult  Loss Factors: Loss of significant relationship  Historical Factors: Impulsivity  Risk Reduction Factors:   Sense of responsibility to family, Religious beliefs about death,  Employed, Living with another person, especially a relative, Positive social support, Positive therapeutic relationship and Positive coping skills or problem solving skills  Continued Clinical Symptoms:  Previous Psychiatric Diagnoses and Treatments  Cognitive Features That Contribute To Risk:  None    Suicide Risk:  Minimal: No identifiable suicidal ideation.  Patients presenting with no risk factors but with morbid ruminations; may be classified as minimal risk based on the severity of the depressive symptoms   Follow-up Information    [facility] [place] Follow up.   Contact information: [address] [place], [place] [address]  Phone: [phone] Fax:         [facility] Follow up.   Contact information: [address] [place], [place] [address]  Phone: [phone]       [facility] Follow up.   Why: intake # [phone] Contact information: [address] [place], [place] [address]  P: [phone] F: [phone]              Plan Of Care/Follow-up recommendations:  Other:  Followup with outpatient care  Clement Sayres, MD 05/05/2020, 1:29 PM

## 2020-05-05 NOTE — Progress Notes (Signed)
  [facility] Adult Case Management Discharge Plan :  Will you be returning to the same living situation after discharge:  Yes,  with mother  At discharge, do you have transportation home?: Yes,  Mother  Do you have the ability to pay for your medications: Yes,  Insurance   Release of information consent forms completed and in the chart;  Patient's signature needed at discharge.  Patient to Follow up at:  Follow-up Information    [facility] [place] Follow up.   Why: This provider is another option for therapy and medication management services in your area. Contact information: [address] [place], [place] [address]  Phone: [phone]        [facility] Follow up.   Why: This provider is another option for therapy and medication management services in your area. Contact information: [address] [place], [place] [address]  Phone: [phone]       [facility] Follow up on 05/10/2020.   Why: You have an appointment on 05/10/20 at 12:00 pm for therapy and medication managment services.  This will be a [facility] manager appointment. Contact information: [address] [place], [place] [address]  P: [phone] F: [phone]              Next level of care provider has access to [facility] Link:no  Safety Planning and Suicide Prevention discussed: Yes,  mother and patient      Has patient been referred to the Quitline?: Patient refused referral  Patient has been referred for addiction treatment: N/A  Aram Beecham, LCSWA 05/05/2020, 2:48 PM

## 2020-05-05 NOTE — Progress Notes (Signed)
RN met with pt and reviewed pt's discharge instructions.  Pt verbalized understanding of discharge instructions and pt did not have any questions. RN reviewed and provided pt with a copy of SRA, AVS and Transition Record.  RN returned pt's belongings to pt.  Pt denied SI/HI/AVH and voiced no concerns.  Pt was appreciative of the care pt received at [facility].  Patient discharged to the lobby without incident. 

## 2020-05-05 NOTE — [facility] Group Notes (Signed)
The focus of this group is to help patients establish daily goals to achieve during treatment and discuss how the patient can incorporate goal setting into their daily lives to aide in recovery.   Patient attended group and contributed to group.
